# Patient Record
Sex: Female | Born: 1935 | Race: White | Hispanic: No | Smoking: Current every day smoker
Health system: Southern US, Community
[De-identification: ages and names within clinical notes are randomized; demographics above are authoritative.]

## PROBLEM LIST (undated history)

## (undated) DIAGNOSIS — I499 Cardiac arrhythmia, unspecified: Secondary | ICD-10-CM

## (undated) DIAGNOSIS — Z8542 Personal history of malignant neoplasm of other parts of uterus: Secondary | ICD-10-CM

## (undated) DIAGNOSIS — I10 Essential (primary) hypertension: Secondary | ICD-10-CM

## (undated) DIAGNOSIS — J449 Chronic obstructive pulmonary disease, unspecified: Secondary | ICD-10-CM

## (undated) DIAGNOSIS — Z720 Tobacco use: Secondary | ICD-10-CM

## (undated) DIAGNOSIS — G629 Polyneuropathy, unspecified: Secondary | ICD-10-CM

## (undated) DIAGNOSIS — I509 Heart failure, unspecified: Secondary | ICD-10-CM

## (undated) HISTORY — DX: Heart failure, unspecified: I50.9

## (undated) HISTORY — DX: Cardiac arrhythmia, unspecified: I49.9

## (undated) HISTORY — DX: Chronic obstructive pulmonary disease, unspecified: J44.9

## (undated) HISTORY — PX: OTHER SURGICAL HISTORY: SHX169

## (undated) HISTORY — PX: ABDOMINAL HYSTERECTOMY: SHX81

## (undated) HISTORY — DX: Tobacco use: Z72.0

## (undated) HISTORY — DX: Personal history of malignant neoplasm of other parts of uterus: Z85.42

## (undated) HISTORY — DX: Essential (primary) hypertension: I10

## (undated) HISTORY — DX: Polyneuropathy, unspecified: G62.9

---

## 2004-08-15 ENCOUNTER — Ambulatory Visit: Payer: Self-pay | Admitting: Internal Medicine

## 2004-11-06 ENCOUNTER — Ambulatory Visit: Payer: Self-pay | Admitting: Internal Medicine

## 2005-01-26 ENCOUNTER — Ambulatory Visit: Payer: Self-pay | Admitting: Internal Medicine

## 2006-09-12 ENCOUNTER — Ambulatory Visit: Payer: Self-pay | Admitting: Internal Medicine

## 2006-09-18 ENCOUNTER — Ambulatory Visit: Payer: Self-pay | Admitting: Orthopedic Surgery

## 2006-09-25 ENCOUNTER — Inpatient Hospital Stay: Payer: Self-pay | Admitting: Vascular Surgery

## 2007-04-02 ENCOUNTER — Ambulatory Visit: Payer: Self-pay

## 2007-11-15 ENCOUNTER — Emergency Department: Payer: Self-pay | Admitting: Unknown Physician Specialty

## 2007-11-24 ENCOUNTER — Ambulatory Visit: Payer: Self-pay | Admitting: Unknown Physician Specialty

## 2007-11-24 ENCOUNTER — Other Ambulatory Visit: Payer: Self-pay

## 2007-11-25 ENCOUNTER — Ambulatory Visit: Payer: Self-pay | Admitting: Unknown Physician Specialty

## 2007-11-27 ENCOUNTER — Ambulatory Visit: Payer: Self-pay | Admitting: Cardiology

## 2009-03-13 HISTORY — PX: HIP FRACTURE SURGERY: SHX118

## 2009-03-28 ENCOUNTER — Inpatient Hospital Stay: Payer: Self-pay | Admitting: General Practice

## 2009-04-02 ENCOUNTER — Encounter: Payer: Self-pay | Admitting: Internal Medicine

## 2009-04-13 ENCOUNTER — Encounter: Payer: Self-pay | Admitting: Internal Medicine

## 2009-05-13 ENCOUNTER — Encounter: Payer: Self-pay | Admitting: Internal Medicine

## 2009-05-30 ENCOUNTER — Inpatient Hospital Stay: Payer: Self-pay | Admitting: Internal Medicine

## 2009-08-02 ENCOUNTER — Ambulatory Visit: Payer: Self-pay | Admitting: Internal Medicine

## 2010-03-13 HISTORY — PX: COLONOSCOPY: SHX174

## 2010-03-17 ENCOUNTER — Inpatient Hospital Stay: Payer: Self-pay | Admitting: Internal Medicine

## 2010-03-23 ENCOUNTER — Encounter: Payer: Self-pay | Admitting: Internal Medicine

## 2010-04-13 ENCOUNTER — Encounter: Payer: Self-pay | Admitting: Internal Medicine

## 2010-05-13 ENCOUNTER — Encounter: Payer: Self-pay | Admitting: Internal Medicine

## 2011-08-14 HISTORY — PX: MOHS SURGERY: SHX181

## 2011-12-14 ENCOUNTER — Inpatient Hospital Stay: Payer: Self-pay | Admitting: Internal Medicine

## 2011-12-14 LAB — TROPONIN I: Troponin-I: <0.02 ng/mL

## 2011-12-14 LAB — PROTIME-INR: Prothrombin Time: 13.4 secs (ref 11.5–14.7)

## 2011-12-14 LAB — COMPREHENSIVE METABOLIC PANEL
Anion Gap: 7 (ref 7–16)
BUN: 12 mg/dL (ref 7–18)
Bilirubin,Total: 0.6 mg/dL (ref 0.2–1.0)
Calcium, Total: 8.5 mg/dL (ref 8.5–10.1)
Chloride: 95 mmol/L — ABNORMAL LOW (ref 98–107)
Creatinine: 0.7 mg/dL (ref 0.60–1.30)
Osmolality: 261 (ref 275–301)
Potassium: 4 mmol/L (ref 3.5–5.1)
SGPT (ALT): 52 U/L
Sodium: 130 mmol/L — ABNORMAL LOW (ref 136–145)
Total Protein: 6.1 g/dL — ABNORMAL LOW (ref 6.4–8.2)

## 2011-12-14 LAB — APTT: Activated PTT: 31 secs (ref 23.6–35.9)

## 2011-12-14 LAB — CBC
HCT: 41.9 % (ref 35.0–47.0)
HGB: 14 g/dL (ref 12.0–16.0)
MCH: 32.1 pg (ref 26.0–34.0)
MCHC: 33.5 g/dL (ref 32.0–36.0)
RBC: 4.37 10*6/uL (ref 3.80–5.20)
RDW: 14.1 % (ref 11.5–14.5)
WBC: 8 10*3/uL (ref 3.6–11.0)

## 2011-12-14 LAB — CK TOTAL AND CKMB (NOT AT ARMC)
CK, Total: 59 U/L (ref 21–215)
CK-MB: 2.2 ng/mL (ref 0.5–3.6)

## 2011-12-14 LAB — PRO B NATRIURETIC PEPTIDE: B-Type Natriuretic Peptide: 3298 pg/mL — ABNORMAL HIGH (ref 0–450)

## 2011-12-14 LAB — MAGNESIUM: Magnesium: 1.5 mg/dL — ABNORMAL LOW

## 2011-12-15 LAB — BASIC METABOLIC PANEL
BUN: 11 mg/dL (ref 7–18)
Calcium, Total: 8.1 mg/dL — ABNORMAL LOW (ref 8.5–10.1)
Chloride: 95 mmol/L — ABNORMAL LOW (ref 98–107)
Co2: 28 mmol/L (ref 21–32)
EGFR (African American): >60
Glucose: 109 mg/dL — ABNORMAL HIGH (ref 65–99)
Osmolality: 263 (ref 275–301)
Potassium: 3.5 mmol/L (ref 3.5–5.1)
Sodium: 131 mmol/L — ABNORMAL LOW (ref 136–145)

## 2011-12-15 LAB — CK-MB: CK-MB: 1.4 ng/mL (ref 0.5–3.6)

## 2011-12-16 LAB — BASIC METABOLIC PANEL
Anion Gap: 6 — ABNORMAL LOW (ref 7–16)
BUN: 13 mg/dL (ref 7–18)
Chloride: 92 mmol/L — ABNORMAL LOW (ref 98–107)
Co2: 29 mmol/L (ref 21–32)
EGFR (African American): >60
EGFR (Non-African Amer.): >60
Glucose: 125 mg/dL — ABNORMAL HIGH (ref 65–99)
Potassium: 3.7 mmol/L (ref 3.5–5.1)
Sodium: 127 mmol/L — ABNORMAL LOW (ref 136–145)

## 2011-12-18 LAB — PLATELET COUNT: Platelet: 214 10*3/uL (ref 150–440)

## 2011-12-20 LAB — CULTURE, BLOOD (SINGLE)

## 2012-01-12 DIAGNOSIS — I509 Heart failure, unspecified: Secondary | ICD-10-CM

## 2012-01-12 HISTORY — DX: Heart failure, unspecified: I50.9

## 2012-01-17 ENCOUNTER — Inpatient Hospital Stay: Payer: Self-pay | Admitting: Student

## 2012-01-17 LAB — COMPREHENSIVE METABOLIC PANEL
BUN: 15 mg/dL (ref 7–18)
Bilirubin,Total: 0.9 mg/dL (ref 0.2–1.0)
Calcium, Total: 8.7 mg/dL (ref 8.5–10.1)
Chloride: 98 mmol/L (ref 98–107)
Co2: 26 mmol/L (ref 21–32)
EGFR (African American): >60
SGOT(AST): 36 U/L (ref 15–37)
Sodium: 134 mmol/L — ABNORMAL LOW (ref 136–145)

## 2012-01-17 LAB — URINALYSIS, COMPLETE
Blood: NEGATIVE
Glucose,UR: NEGATIVE mg/dL (ref 0–75)
Hyaline Cast: 1
Ketone: NEGATIVE
Nitrite: NEGATIVE
Ph: 6 (ref 4.5–8.0)
RBC,UR: <1 /HPF (ref 0–5)
Transitional Epi: <1
WBC UR: 5 /HPF (ref 0–5)

## 2012-01-17 LAB — CBC WITH DIFFERENTIAL/PLATELET
Basophil #: 0 10*3/uL (ref 0.0–0.1)
Basophil %: 0.4 %
Eosinophil #: 0.1 10*3/uL (ref 0.0–0.7)
Eosinophil %: 1 %
Lymphocyte %: 11.5 %
MCHC: 33.2 g/dL (ref 32.0–36.0)
MCV: 97 fL (ref 80–100)
Monocyte #: 0.9 x10 3/mm (ref 0.2–0.9)
Neutrophil #: 6.2 10*3/uL (ref 1.4–6.5)
Platelet: 202 10*3/uL (ref 150–440)
RDW: 14.5 % (ref 11.5–14.5)

## 2012-01-17 LAB — PRO B NATRIURETIC PEPTIDE: B-Type Natriuretic Peptide: 2929 pg/mL — ABNORMAL HIGH (ref 0–450)

## 2012-01-17 LAB — TSH: Thyroid Stimulating Horm: 2.46 u[IU]/mL

## 2012-01-18 DIAGNOSIS — R0602 Shortness of breath: Secondary | ICD-10-CM

## 2012-01-18 LAB — CBC WITH DIFFERENTIAL/PLATELET
Basophil %: 0.3 %
Eosinophil %: 0.7 %
HCT: 46.9 % (ref 35.0–47.0)
HGB: 15.3 g/dL (ref 12.0–16.0)
Monocyte %: 10 %
Neutrophil #: 6.6 10*3/uL — ABNORMAL HIGH (ref 1.4–6.5)
Neutrophil %: 76.4 %
RBC: 4.86 10*6/uL (ref 3.80–5.20)
RDW: 14.4 % (ref 11.5–14.5)

## 2012-01-18 LAB — BASIC METABOLIC PANEL WITH GFR
Anion Gap: 10
BUN: 14 mg/dL
Calcium, Total: 8.8 mg/dL
Chloride: 101 mmol/L
Co2: 27 mmol/L
Creatinine: 0.66 mg/dL
EGFR (African American): >60
EGFR (Non-African Amer.): >60
Glucose: 128 mg/dL — ABNORMAL HIGH
Osmolality: 278
Potassium: 3.5 mmol/L
Sodium: 138 mmol/L

## 2012-01-18 LAB — MAGNESIUM: Magnesium: 1.7 mg/dL — ABNORMAL LOW

## 2012-01-19 DIAGNOSIS — I5043 Acute on chronic combined systolic (congestive) and diastolic (congestive) heart failure: Secondary | ICD-10-CM

## 2012-01-19 LAB — BASIC METABOLIC PANEL
BUN: 13 mg/dL (ref 7–18)
Chloride: 94 mmol/L — ABNORMAL LOW (ref 98–107)
Co2: 30 mmol/L (ref 21–32)
Creatinine: 0.59 mg/dL — ABNORMAL LOW (ref 0.60–1.30)
EGFR (Non-African Amer.): >60
Glucose: 125 mg/dL — ABNORMAL HIGH (ref 65–99)
Potassium: 3.2 mmol/L — ABNORMAL LOW (ref 3.5–5.1)

## 2012-01-19 LAB — MAGNESIUM: Magnesium: 1.4 mg/dL — ABNORMAL LOW

## 2012-01-20 LAB — BASIC METABOLIC PANEL
Anion Gap: 11 (ref 7–16)
BUN: 13 mg/dL (ref 7–18)
Calcium, Total: 9.2 mg/dL (ref 8.5–10.1)
Chloride: 91 mmol/L — ABNORMAL LOW (ref 98–107)
Co2: 31 mmol/L (ref 21–32)
Creatinine: 0.7 mg/dL (ref 0.60–1.30)
EGFR (African American): >60
EGFR (Non-African Amer.): >60

## 2012-01-21 LAB — BASIC METABOLIC PANEL
BUN: 15 mg/dL (ref 7–18)
Calcium, Total: 8.8 mg/dL (ref 8.5–10.1)
EGFR (African American): >60
Glucose: 171 mg/dL — ABNORMAL HIGH (ref 65–99)
Potassium: 3.9 mmol/L (ref 3.5–5.1)
Sodium: 131 mmol/L — ABNORMAL LOW (ref 136–145)

## 2012-01-22 LAB — BASIC METABOLIC PANEL
Calcium, Total: 8.8 mg/dL (ref 8.5–10.1)
Chloride: 95 mmol/L — ABNORMAL LOW (ref 98–107)
EGFR (Non-African Amer.): >60
Potassium: 4.5 mmol/L (ref 3.5–5.1)
Sodium: 133 mmol/L — ABNORMAL LOW (ref 136–145)

## 2012-01-22 LAB — MAGNESIUM: Magnesium: 1.8 mg/dL

## 2012-01-22 LAB — PLATELET COUNT: Platelet: 198 10*3/uL (ref 150–440)

## 2012-01-23 ENCOUNTER — Encounter: Payer: Self-pay | Admitting: Internal Medicine

## 2012-01-30 ENCOUNTER — Encounter: Payer: Self-pay | Admitting: Cardiovascular Disease

## 2012-02-04 ENCOUNTER — Encounter: Payer: Self-pay | Admitting: Cardiovascular Disease

## 2012-02-04 ENCOUNTER — Ambulatory Visit (INDEPENDENT_AMBULATORY_CARE_PROVIDER_SITE_OTHER): Payer: Medicare Other | Admitting: Cardiovascular Disease

## 2012-02-04 VITALS — BP 110/62 | HR 76 | Ht 64.0 in | Wt 146.0 lb

## 2012-02-04 DIAGNOSIS — I272 Pulmonary hypertension, unspecified: Secondary | ICD-10-CM | POA: Insufficient documentation

## 2012-02-04 DIAGNOSIS — R0602 Shortness of breath: Secondary | ICD-10-CM

## 2012-02-04 DIAGNOSIS — I5042 Chronic combined systolic (congestive) and diastolic (congestive) heart failure: Secondary | ICD-10-CM

## 2012-02-04 DIAGNOSIS — J4489 Other specified chronic obstructive pulmonary disease: Secondary | ICD-10-CM

## 2012-02-04 DIAGNOSIS — J449 Chronic obstructive pulmonary disease, unspecified: Secondary | ICD-10-CM

## 2012-02-04 DIAGNOSIS — I4891 Unspecified atrial fibrillation: Secondary | ICD-10-CM

## 2012-02-04 DIAGNOSIS — I2789 Other specified pulmonary heart diseases: Secondary | ICD-10-CM

## 2012-02-04 DIAGNOSIS — I509 Heart failure, unspecified: Secondary | ICD-10-CM

## 2012-02-04 NOTE — Assessment & Plan Note (Signed)
We have encouraged her to continue to work on weaning her cigarettes and smoking cessation. She will continue to work on this and does not want any assistance with chantix.  

## 2012-02-04 NOTE — Assessment & Plan Note (Signed)
Appears to be doing much better after her recent hospital admission 3 weeks ago. No evidence of significant fluid retention. We will continue her on her current medications.

## 2012-02-04 NOTE — Progress Notes (Signed)
Patient ID: Kristina Navarro, female    DOB: Jun 24, 1926, 78 y.o.   MRN: [id number]  HPI Comments: Kristina Navarro is a very pleasant 78 year old woman with a history of chronic atrial fibrillation, systolic and diastolic CHF with ejection fraction 40-45%, pulmonary hypertension, severe COPD with long smoking history who continues to smoke, who was in the hospital in May 2013 with increasing shortness of breath and leg swelling and discharge to rehabilitation facility who was admitted back to the hospital June 7 with shortness of breath. She was noted to be in atrial fibrillation with RVR, rate 140 beats per minute. BNP on admission was 3000  Her medications were adjusted, started on diltiazem, digoxin, continued on metoprolol with good rate control. Lasix was increased to 40 mg twice a day and she presents to [place] today. She reports that she is feeling well, is doing rehabilitation at [facility] rehabilitation. She is using a walker and has a caretaker. She denies any significant shortness of breath. She does have minimal edema in the left ankle, much better than before. She is drinking less now that we have than she normally does at home   Notes from May 2013 indicate she is not on Coumadin or anticoagulation due to her choice and history of GI bleed in the past, notes indicating small bowel bleed in 2011  Last echocardiogram 12/15/2011 showing ejection fraction 40-45%, LVH, moderate to severe TR, moderate MR, right trigger systolic pressure 39 mmHg   EKG shows atrial fibrillation with ventricular rate 76 beats per minute with old anterior infarct, left anterior fascicular block   Outpatient Encounter Prescriptions as of 02/04/2012  Medication Sig Dispense Refill  . aspirin 325 MG tablet 325 mg. Monday, Wednesday and Friday.      . calcipotriene (DOVONOX) 0.005 % cream Apply topically 2 (two) times daily.      . Cholecalciferol (VITAMIN D3) 400 UNITS CAPS Take 400 Units by mouth daily.       .  digoxin (LANOXIN) 0.125 MG tablet Take 125 mcg by mouth daily.      Marland Kitchen diltiazem (DILACOR XR) 240 MG 24 hr capsule Take 240 mg by mouth daily.      . furosemide (LASIX) 40 MG tablet Take 40 mg by mouth 2 (two) times daily.      . Ipratropium-Albuterol (COMBIVENT IN) Inhale 2 puffs into the lungs as needed.      . magnesium oxide (MAG-OX) 400 MG tablet Take 400 mg by mouth daily.      . metoprolol (LOPRESSOR) 50 MG tablet Take 50 mg by mouth 2 (two) times daily.      . Multiple Vitamin (MULTIVITAMIN) tablet Take 1 tablet by mouth 2 (two) times daily.       . NON FORMULARY Mineral capsule twice daily.      . NON FORMULARY Bone support capsule daily.      . NON FORMULARY Zygest capsule daily.      . potassium chloride SA (K-DUR,KLOR-CON) 20 MEQ tablet Take 20 mEq by mouth daily.      . raloxifene (EVISTA) 60 MG tablet Take 60 mg by mouth daily.        Review of Systems  Constitutional: Negative.   HENT: Negative.   Eyes: Negative.   Respiratory: Negative.   Cardiovascular: Negative.   Gastrointestinal: Negative.   Musculoskeletal: Negative.   Skin: Negative.   Neurological: Negative.   Hematological: Negative.   Psychiatric/Behavioral: Negative.   All other systems reviewed and are negative.  BP 110/62  Pulse 76  Ht 5\' 4"  (1.626 m)  Wt 146 lb (66.225 kg)  BMI 25.06 kg/m2  Physical Exam  Nursing note and vitals reviewed. Constitutional: She is oriented to person, place, and time. She appears well-developed and well-nourished.  HENT:  Head: Normocephalic.  Nose: Nose normal.  Mouth/Throat: Oropharynx is clear and moist.  Eyes: Conjunctivae are normal. Pupils are equal, round, and reactive to light.  Neck: Normal range of motion. Neck supple. No JVD present.  Cardiovascular: Normal rate, regular rhythm, S1 normal, S2 normal, normal heart sounds and intact distal pulses.  Exam reveals no gallop and no friction rub.   No murmur heard.      Minimal edema of the left lower  extremity  Pulmonary/Chest: Effort normal. No respiratory distress. She has decreased breath sounds. She has no wheezes. She has no rales. She exhibits no tenderness.  Abdominal: Soft. Bowel sounds are normal. She exhibits no distension. There is no tenderness.  Musculoskeletal: Normal range of motion. She exhibits edema. She exhibits no tenderness.  Lymphadenopathy:    She has no cervical adenopathy.  Neurological: She is alert and oriented to person, place, and time. Coordination normal.  Skin: Skin is warm and dry. No rash noted. No erythema.  Psychiatric: She has a normal mood and affect. Her behavior is normal. Judgment and thought content normal.         Assessment and Plan

## 2012-02-04 NOTE — Assessment & Plan Note (Signed)
Chronic atrial fibrillation, not on anticoagulation (per the notes by her request and history of GI bleed). We will discuss this with her on her next visit. Rate is well controlled on her current medication regimen.

## 2012-02-04 NOTE — Assessment & Plan Note (Signed)
Pulmonary hypertension seen on previous echocardiogram. We'll continue aggressive diuresis and limit fluid intake.

## 2012-02-04 NOTE — Patient Instructions (Addendum)
You are doing well. No medication changes were made.  Watch the weight. Call the office for weight gain more than 3 pounds.   Please call us if you have new issues that need to be addressed before your next appt.  Your physician wants you to follow-up in: 1 months.

## 2012-02-11 ENCOUNTER — Encounter: Payer: Self-pay | Admitting: Internal Medicine

## 2012-03-05 ENCOUNTER — Encounter: Payer: Self-pay | Admitting: Cardiovascular Disease

## 2012-03-05 ENCOUNTER — Ambulatory Visit (INDEPENDENT_AMBULATORY_CARE_PROVIDER_SITE_OTHER): Payer: Medicare Other | Admitting: Cardiovascular Disease

## 2012-03-05 VITALS — BP 102/72 | HR 99 | Ht 64.5 in | Wt 148.0 lb

## 2012-03-05 DIAGNOSIS — I509 Heart failure, unspecified: Secondary | ICD-10-CM

## 2012-03-05 DIAGNOSIS — J449 Chronic obstructive pulmonary disease, unspecified: Secondary | ICD-10-CM

## 2012-03-05 DIAGNOSIS — I5042 Chronic combined systolic (congestive) and diastolic (congestive) heart failure: Secondary | ICD-10-CM

## 2012-03-05 DIAGNOSIS — I4891 Unspecified atrial fibrillation: Secondary | ICD-10-CM

## 2012-03-05 NOTE — Assessment & Plan Note (Signed)
Heart rate is adequately controlled, chronic atrial fibrillation. We had a long discussion about anticoagulation and she prefers no anticoagulation at this time. She does have a history of GI bleed while she was taking NSAIDs. She has had several recent falls and uses a walker. We are referring her back to physical therapy.

## 2012-03-05 NOTE — Assessment & Plan Note (Signed)
We have suggested she stay on her current medication regiment. We will continue aggressive diuresis. She has followup with Dr. Arlana Pouch in the next week or so. We have suggested she talk with him about a digoxin level, basic metabolic panel and any other lab work he might need.

## 2012-03-05 NOTE — Progress Notes (Signed)
Patient ID: Kristina Navarro, female    DOB: October 24, 1925, 78 y.o.   MRN: [id number]  HPI Comments: Kristina Navarro is a very pleasant 78 year old woman with a history of chronic atrial fibrillation, systolic and diastolic CHF with ejection fraction 40-45%, pulmonary hypertension, severe COPD with long smoking history, who was in the hospital in May 2013 with increasing shortness of breath and leg swelling and discharge to rehabilitation facility who was admitted back to the hospital June 7 with shortness of breath. She was noted to be in atrial fibrillation with RVR, rate 140 beats per minute. BNP on admission was 3000  Her medications were adjusted, started on diltiazem, digoxin, continued on metoprolol with good rate control. Lasix was increased to 40 mg twice a day. She completed  [facility] rehabilitation. She is using a walker and has a caretaker.  She is drinking less now that we have than she normally does at home. He denies any tachycardia. Mild baseline shortness of breath. She would like to continue physical therapy at [facility].   Notes from May 2013 indicate she is not on Coumadin or anticoagulation due to her choice and history of GI bleed in the past, notes indicating small bowel bleed in 2011  Last echocardiogram 12/15/2011 showing ejection fraction 40-45%, LVH, moderate to severe TR, moderate MR, right trigger systolic pressure 39 mmHg   EKG shows atrial fibrillation with ventricular rate 99 beats per minute with old anterior infarct, left anterior fascicular block Rate did improve/slow with rest   Outpatient Encounter Prescriptions as of 03/05/2012  Medication Sig Dispense Refill  . aspirin 325 MG tablet 325 mg. Monday, Wednesday and Friday.      . calcipotriene (DOVONOX) 0.005 % cream Apply topically 2 (two) times daily.      . Cholecalciferol (VITAMIN D3) 400 UNITS CAPS Take 400 Units by mouth daily.       . digoxin (LANOXIN) 0.125 MG tablet Take 125 mcg by mouth daily.      Marland Kitchen  diltiazem (DILACOR XR) 240 MG 24 hr capsule Take 240 mg by mouth daily.      . furosemide (LASIX) 40 MG tablet Take 40 mg by mouth 2 (two) times daily.      . Ipratropium-Albuterol (COMBIVENT IN) Inhale 2 puffs into the lungs as needed.      . magnesium oxide (MAG-OX) 400 MG tablet Take 400 mg by mouth daily.      . metoprolol (LOPRESSOR) 50 MG tablet Take 50 mg by mouth 2 (two) times daily.      . Multiple Vitamin (MULTIVITAMIN) tablet Take 1 tablet by mouth 2 (two) times daily.       . NON FORMULARY Mineral capsule twice daily.      . NON FORMULARY Bone support capsule daily.      . NON FORMULARY Zygest capsule daily.      . potassium chloride SA (K-DUR,KLOR-CON) 20 MEQ tablet Take 20 mEq by mouth daily.      . raloxifene (EVISTA) 60 MG tablet Take 60 mg by mouth daily.         Review of Systems  Constitutional: Negative.   HENT: Negative.   Eyes: Negative.   Respiratory: Positive for shortness of breath.   Cardiovascular: Positive for palpitations.  Gastrointestinal: Negative.   Musculoskeletal: Positive for gait problem.  Skin: Negative.   Neurological: Negative.   Hematological: Negative.   Psychiatric/Behavioral: Negative.   All other systems reviewed and are negative.   BP 102/72  Pulse 99  Ht 5' 4.5" (1.638 m)  Wt 148 lb (67.132 kg)  BMI 25.01 kg/m2  Physical Exam  Nursing note and vitals reviewed. Constitutional: She is oriented to person, place, and time. She appears well-developed and well-nourished.  HENT:  Head: Normocephalic.  Nose: Nose normal.  Mouth/Throat: Oropharynx is clear and moist.  Eyes: Conjunctivae are normal. Pupils are equal, round, and reactive to light.  Neck: Normal range of motion. Neck supple. No JVD present.  Cardiovascular: Normal rate, S1 normal, S2 normal and intact distal pulses.  An irregularly irregular rhythm present. Exam reveals no gallop and no friction rub.   Murmur heard.  Systolic murmur is present with a grade of 2/6        Minimal edema of the left lower extremity  Pulmonary/Chest: Effort normal. No respiratory distress. She has decreased breath sounds. She has no wheezes. She has no rales. She exhibits no tenderness.  Abdominal: Soft. Bowel sounds are normal. She exhibits no distension. There is no tenderness.  Musculoskeletal: Normal range of motion. She exhibits no tenderness.  Lymphadenopathy:    She has no cervical adenopathy.  Neurological: She is alert and oriented to person, place, and time. Coordination normal.  Skin: Skin is warm and dry. No rash noted. No erythema.  Psychiatric: She has a normal mood and affect. Her behavior is normal. Judgment and thought content normal.         Assessment and Plan

## 2012-03-05 NOTE — Patient Instructions (Addendum)
You are doing well. No medication changes were made.  Please call us if you have new issues that need to be addressed before your next appt.  Your physician wants you to follow-up in: 3 months You will receive a reminder letter in the mail two months in advance. If you don't receive a letter, please call our office to schedule the follow-up appointment.   

## 2012-03-05 NOTE — Assessment & Plan Note (Signed)
Stable baseline mild shortness of breath. On inhalers.

## 2012-03-10 ENCOUNTER — Telehealth: Payer: Self-pay | Admitting: Cardiovascular Disease

## 2012-03-10 NOTE — Telephone Encounter (Signed)
Left message with caretaker re: referral has been faxed and pt should get correspondence from PT at [facility]. Understanding verb

## 2012-03-10 NOTE — Telephone Encounter (Signed)
Pt was wanting to know if you had heard anything about her referral for PT

## 2012-03-10 NOTE — Telephone Encounter (Signed)
Referral faxed

## 2012-04-01 ENCOUNTER — Encounter: Payer: Self-pay | Admitting: Cardiovascular Disease

## 2012-04-13 ENCOUNTER — Encounter: Payer: Self-pay | Admitting: Cardiovascular Disease

## 2012-05-13 ENCOUNTER — Encounter: Payer: Self-pay | Admitting: Cardiovascular Disease

## 2012-06-05 ENCOUNTER — Encounter: Payer: Self-pay | Admitting: Cardiovascular Disease

## 2012-06-05 ENCOUNTER — Ambulatory Visit (INDEPENDENT_AMBULATORY_CARE_PROVIDER_SITE_OTHER): Payer: Medicare Other | Admitting: Cardiovascular Disease

## 2012-06-05 VITALS — BP 129/76 | HR 83 | Wt 143.0 lb

## 2012-06-05 DIAGNOSIS — J449 Chronic obstructive pulmonary disease, unspecified: Secondary | ICD-10-CM

## 2012-06-05 DIAGNOSIS — I509 Heart failure, unspecified: Secondary | ICD-10-CM

## 2012-06-05 DIAGNOSIS — I5042 Chronic combined systolic (congestive) and diastolic (congestive) heart failure: Secondary | ICD-10-CM

## 2012-06-05 DIAGNOSIS — I4891 Unspecified atrial fibrillation: Secondary | ICD-10-CM

## 2012-06-05 MED ORDER — MAGNESIUM OXIDE 400 MG PO TABS: 400.0000 mg | ORAL_TABLET | Freq: Every day | ORAL | Status: DC

## 2012-06-05 NOTE — Progress Notes (Signed)
Patient ID: Kristina Navarro, female    DOB: 08-May-1926, 78 y.o.   MRN: [id number]  HPI Comments: Kristina Navarro is a very pleasant 78 year old woman with a history of chronic atrial fibrillation, systolic and diastolic CHF with ejection fraction 40-45%, pulmonary hypertension, severe COPD with long smoking history, who was in the hospital in May 2013 with increasing shortness of breath and leg swelling and discharge to rehabilitation facility who was admitted back to the hospital June 7 with shortness of breath. She was noted to be in atrial fibrillation with RVR, rate 140 beats per minute. BNP on admission was 3000  Her medications were adjusted, started on diltiazem, digoxin, continued on metoprolol with good rate control. Lasix was increased to 40 mg twice a day. She completed  [facility] rehabilitation. She is using a walker and has a caretaker. She reports that she is doing well. She continues to smoke daily and will continue to do so. Her weight is down from her last clinic visit approximately 4-5 pounds. She continues to have trace edema of her legs and she keeps her legs elevated when she can. Her legs were very weak and she participated in physical therapy at [facility] with good results.  Anticoagulation again was discussed with her and she has indicated she does not want warfarin for atrial fibrillation. She is otherwise active and has no complaints   Notes from May 2013 indicate she is not on Coumadin or anticoagulation due to her choice and history of GI bleed in the past, notes indicating small bowel bleed in 2011  Last echocardiogram 12/15/2011 showing ejection fraction 40-45%, LVH, moderate to severe TR, moderate MR, right trigger systolic pressure 39 mmHg   EKG shows atrial fibrillation with ventricular rate 83 beats per minute with old anterior MI, left anterior fascicular block   Outpatient Encounter Prescriptions as of 06/05/2012  Medication Sig Dispense Refill  . aspirin 325 MG  tablet 325 mg. Monday, Wednesday and Friday.      . calcipotriene (DOVONOX) 0.005 % cream Apply topically 2 (two) times daily.      . Cholecalciferol (VITAMIN D3) 400 UNITS CAPS Take 400 Units by mouth daily.       . digoxin (LANOXIN) 0.125 MG tablet Take 125 mcg by mouth daily.      Marland Kitchen diltiazem (DILACOR XR) 240 MG 24 hr capsule Take 240 mg by mouth daily.      . furosemide (LASIX) 40 MG tablet Take 40 mg by mouth 2 (two) times daily.      . Ipratropium-Albuterol (COMBIVENT IN) Inhale 2 puffs into the lungs as needed.      . magnesium oxide (MAG-OX) 400 MG tablet Take 1 tablet (400 mg total) by mouth daily.  90 tablet  3  . metoprolol (LOPRESSOR) 50 MG tablet Take 50 mg by mouth 2 (two) times daily.      . Multiple Vitamin (MULTIVITAMIN) tablet Take 1 tablet by mouth 2 (two) times daily.       . NON FORMULARY Mineral capsule twice daily.      . NON FORMULARY Bone support capsule daily.      . NON FORMULARY Zygest capsule daily.      . potassium chloride SA (K-DUR,KLOR-CON) 20 MEQ tablet Take 20 mEq by mouth daily.      . raloxifene (EVISTA) 60 MG tablet Take 60 mg by mouth daily.      Marland Kitchen DISCONTD: magnesium oxide (MAG-OX) 400 MG tablet Take 400 mg by mouth daily.  Review of Systems  Constitutional: Negative.   HENT: Negative.   Eyes: Negative.   Respiratory: Positive for shortness of breath.   Gastrointestinal: Negative.   Musculoskeletal: Positive for gait problem.  Skin: Negative.   Neurological: Negative.   Hematological: Negative.   Psychiatric/Behavioral: Negative.   All other systems reviewed and are negative.   BP 129/76  Pulse 83  Wt 143 lb (64.864 kg)  Physical Exam  Nursing note and vitals reviewed. Constitutional: She is oriented to person, place, and time. She appears well-developed and well-nourished.  HENT:  Head: Normocephalic.  Nose: Nose normal.  Mouth/Throat: Oropharynx is clear and moist.  Eyes: Conjunctivae normal are normal. Pupils are equal,  round, and reactive to light.  Neck: Normal range of motion. Neck supple. No JVD present.  Cardiovascular: Normal rate, S1 normal, S2 normal and intact distal pulses.  An irregularly irregular rhythm present. Exam reveals no gallop and no friction rub.   Murmur heard.  Systolic murmur is present with a grade of 2/6       Minimal edema of the left lower extremity  Pulmonary/Chest: Effort normal. No respiratory distress. She has decreased breath sounds. She has no wheezes. She has no rales. She exhibits no tenderness.  Abdominal: Soft. Bowel sounds are normal. She exhibits no distension. There is no tenderness.  Musculoskeletal: Normal range of motion. She exhibits no tenderness.  Lymphadenopathy:    She has no cervical adenopathy.  Neurological: She is alert and oriented to person, place, and time. Coordination normal.  Skin: Skin is warm and dry. No rash noted. No erythema.  Psychiatric: She has a normal mood and affect. Her behavior is normal. Judgment and thought content normal.         Assessment and Plan

## 2012-06-05 NOTE — Assessment & Plan Note (Signed)
Rate is adequately controlled on her current medication regimen. She does not want anticoagulation. No signs of worsening diastolic CHF. We will continue her on her current medications.

## 2012-06-05 NOTE — Patient Instructions (Addendum)
You are doing well. No medication changes were made.  Please call us if you have new issues that need to be addressed before your next appt.  Your physician wants you to follow-up in: 6 months.  You will receive a reminder letter in the mail two months in advance. If you don't receive a letter, please call our office to schedule the follow-up appointment.   

## 2012-06-05 NOTE — Assessment & Plan Note (Signed)
We have encouraged her to continue to work on weaning her cigarettes and smoking cessation. She will continue to work on this and does not want any assistance with chantix.  

## 2012-06-05 NOTE — Assessment & Plan Note (Signed)
Weight is down from her prior visit. Stable shortness of breath likely from COPD. No medication changes.

## 2012-12-10 ENCOUNTER — Ambulatory Visit (INDEPENDENT_AMBULATORY_CARE_PROVIDER_SITE_OTHER): Payer: Medicare Other | Admitting: Cardiovascular Disease

## 2012-12-10 ENCOUNTER — Encounter: Payer: Self-pay | Admitting: Cardiovascular Disease

## 2012-12-10 VITALS — BP 123/73 | HR 70 | Ht 64.5 in | Wt 141.2 lb

## 2012-12-10 DIAGNOSIS — I509 Heart failure, unspecified: Secondary | ICD-10-CM

## 2012-12-10 DIAGNOSIS — I5042 Chronic combined systolic (congestive) and diastolic (congestive) heart failure: Secondary | ICD-10-CM

## 2012-12-10 DIAGNOSIS — R0602 Shortness of breath: Secondary | ICD-10-CM

## 2012-12-10 DIAGNOSIS — J449 Chronic obstructive pulmonary disease, unspecified: Secondary | ICD-10-CM

## 2012-12-10 DIAGNOSIS — Z79899 Other long term (current) drug therapy: Secondary | ICD-10-CM

## 2012-12-10 DIAGNOSIS — I4891 Unspecified atrial fibrillation: Secondary | ICD-10-CM

## 2012-12-10 NOTE — Assessment & Plan Note (Signed)
Shortness of breath is relatively stable. She is relatively sedentary

## 2012-12-10 NOTE — Assessment & Plan Note (Signed)
Chronic mild shortness of breath. She continues to smoke.

## 2012-12-10 NOTE — Progress Notes (Signed)
Patient ID: Kristina Navarro, female    DOB: 27-Jun-1926, 79 y.o.   MRN: [id number]  HPI Comments: Kristina Navarro is a very pleasant 79 year old woman with a history of chronic atrial fibrillation, systolic and diastolic CHF with ejection fraction 40-45%, pulmonary hypertension, severe COPD with long smoking history, who was in the hospital in May 2013 with increasing shortness of breath and leg swelling and discharged to rehabilitation facility who was admitted back to the hospital January 18 2012 with shortness of breath. She was noted to be in atrial fibrillation with RVR, rate 140 beats per minute. BNP on admission was 3000 small bowel bleed in 2011  Her medications were adjusted, started on diltiazem, digoxin, continued on metoprolol with good rate control. Lasix was increased to 40 mg twice a day. She completed  [facility]. She has a caretaker. She reports that she is doing well. She continues to smoke daily and "will continue to do so".  She has edema of her legs, particularly her feet today. She took only a 1/2 lasix yesterday PM. Appeared to reports edema was better yesterday . she keeps her legs elevated when she can.   Anticoagulation for atrial fibrillation was discussed with her and she has indicated she does not want warfarin for atrial fibrillation. She is otherwise active and has no complaints   Notes from May 2013 indicate she is not on Coumadin or anticoagulation due to her choice and history of GI bleed in the past, notes indicating small bowel bleed in 2011  Last echocardiogram 12/15/2011 showing ejection fraction 40-45%, LVH, moderate to severe TR, moderate MR, right trigger systolic pressure 39 mmHg Cholesterol 148, LDL 71, HDL 57 in October 2012   EKG shows atrial fibrillation with ventricular rate 70 beats per minute with old anterior MI, left anterior fascicular block   Outpatient Encounter Prescriptions as of 12/10/2012  Medication Sig Dispense Refill  . aspirin  325 MG tablet 325 mg. Monday, Wednesday and Friday.      . calcipotriene (DOVONOX) 0.005 % cream Apply topically 2 (two) times daily.      . Cholecalciferol (VITAMIN D3) 400 UNITS CAPS Take 400 Units by mouth daily.       . digoxin (LANOXIN) 0.125 MG tablet Take 125 mcg by mouth daily.      Marland Kitchen diltiazem (DILACOR XR) 240 MG 24 hr capsule Take 240 mg by mouth daily.      . furosemide (LASIX) 40 MG tablet Take 40 mg by mouth 2 (two) times daily.      . Ipratropium-Albuterol (COMBIVENT IN) Inhale 2 puffs into the lungs as needed.      . metoprolol (LOPRESSOR) 50 MG tablet Take 50 mg by mouth 2 (two) times daily.      . Multiple Vitamin (MULTIVITAMIN) tablet Take 1 tablet by mouth 2 (two) times daily.       . NON FORMULARY Mineral capsule twice daily.      . NON FORMULARY Bone support capsule daily.      . NON FORMULARY Zygest capsule daily.      . potassium chloride SA (K-DUR,KLOR-CON) 20 MEQ tablet Take 20 mEq by mouth daily.      . raloxifene (EVISTA) 60 MG tablet Take 60 mg by mouth daily.      . [DISCONTINUED] magnesium oxide (MAG-OX) 400 MG tablet Take 1 tablet (400 mg total) by mouth daily.  90 tablet  3   No facility-administered encounter medications on file as of 12/10/2012.  Review of Systems  Constitutional: Negative.   HENT: Negative.   Eyes: Negative.   Respiratory: Positive for shortness of breath.   Cardiovascular: Positive for leg swelling.  Gastrointestinal: Negative.   Musculoskeletal: Positive for gait problem.  Skin: Negative.   Neurological: Negative.   Psychiatric/Behavioral: Negative.   All other systems reviewed and are negative.   BP 123/73  Pulse 70  Ht 5' 4.5" (1.638 m)  Wt 141 lb 4 oz (64.071 kg)  BMI 23.88 kg/m2  Physical Exam  Nursing note and vitals reviewed. Constitutional: She is oriented to person, place, and time. She appears well-developed and well-nourished.  HENT:  Head: Normocephalic.  Nose: Nose normal.  Mouth/Throat: Oropharynx is  clear and moist.  Eyes: Conjunctivae are normal. Pupils are equal, round, and reactive to light.  Neck: Normal range of motion. Neck supple. No JVD present.  Cardiovascular: Normal rate, S1 normal, S2 normal and intact distal pulses.  An irregularly irregular rhythm present. Exam reveals no gallop and no friction rub.   Murmur heard.  Systolic murmur is present with a grade of 2/6  Minimal edema of the left lower extremity, 1+ edema in the feet  Pulmonary/Chest: Effort normal. No respiratory distress. She has decreased breath sounds. She has no wheezes. She has no rales. She exhibits no tenderness.  Abdominal: Soft. Bowel sounds are normal. She exhibits no distension. There is no tenderness.  Musculoskeletal: Normal range of motion. She exhibits no edema and no tenderness.  Lymphadenopathy:    She has no cervical adenopathy.  Neurological: She is alert and oriented to person, place, and time. Coordination normal.  Skin: Skin is warm and dry. No rash noted. No erythema.  Psychiatric: She has a normal mood and affect. Her behavior is normal. Judgment and thought content normal.    Assessment and Plan

## 2012-12-10 NOTE — Patient Instructions (Addendum)
You are doing well. No medication changes were made.  We will do blood work today  Please call us if you have new issues that need to be addressed before your next appt.  Your physician wants you to follow-up in: 6 months.  You will receive a reminder letter in the mail two months in advance. If you don't receive a letter, please call our office to schedule the follow-up appointment.   

## 2012-12-10 NOTE — Assessment & Plan Note (Signed)
Rate is reasonably well controlled on her current medications. She does not want anticoagulation and is aware of the risk of stroke

## 2012-12-10 NOTE — Assessment & Plan Note (Addendum)
We've recommended that she stay on her Lasix, take extra Lasix for worsening edema. No recent blood work. We will do basic metabolic panel today and digoxin level.

## 2012-12-11 LAB — DIGOXIN LEVEL: Digoxin Level: 1.2 ng/mL (ref 0.9–2.0)

## 2012-12-11 LAB — BASIC METABOLIC PANEL
BUN: 11 mg/dL (ref 8–27)
GFR calc Af Amer: 96 mL/min/{1.73_m2} (ref >59)
GFR calc non Af Amer: 83 mL/min/{1.73_m2} (ref >59)
Glucose: 123 mg/dL — ABNORMAL HIGH (ref 65–99)

## 2013-01-11 ENCOUNTER — Ambulatory Visit: Payer: Self-pay | Admitting: Internal Medicine

## 2013-01-20 LAB — CBC
HGB: 15.4 g/dL (ref 12.0–16.0)
MCHC: 33.4 g/dL (ref 32.0–36.0)
MCV: 93 fL (ref 80–100)
Platelet: 83 10*3/uL — ABNORMAL LOW (ref 150–440)
RBC: 4.93 10*6/uL (ref 3.80–5.20)

## 2013-01-20 LAB — COMPREHENSIVE METABOLIC PANEL
Albumin: 3.4 g/dL (ref 3.4–5.0)
Anion Gap: 10 (ref 7–16)
BUN: 12 mg/dL (ref 7–18)
Calcium, Total: 8.7 mg/dL (ref 8.5–10.1)
Chloride: 96 mmol/L — ABNORMAL LOW (ref 98–107)
Co2: 26 mmol/L (ref 21–32)
EGFR (Non-African Amer.): >60
Glucose: 154 mg/dL — ABNORMAL HIGH (ref 65–99)
Potassium: 3.3 mmol/L — ABNORMAL LOW (ref 3.5–5.1)
SGOT(AST): 30 U/L (ref 15–37)

## 2013-01-21 ENCOUNTER — Inpatient Hospital Stay: Payer: Self-pay | Admitting: Internal Medicine

## 2013-01-21 LAB — CBC WITH DIFFERENTIAL/PLATELET
Basophil #: 0 10*3/uL (ref 0.0–0.1)
Eosinophil #: 0 10*3/uL (ref 0.0–0.7)
Eosinophil %: 0.6 %
HCT: 47 % (ref 35.0–47.0)
MCHC: 33.7 g/dL (ref 32.0–36.0)
Monocyte #: 0.1 x10 3/mm — ABNORMAL LOW (ref 0.2–0.9)
Monocyte %: 20.6 %
Neutrophil #: 0.1 10*3/uL — ABNORMAL LOW (ref 1.4–6.5)
Neutrophil %: 25.8 %
RDW: 14.4 % (ref 11.5–14.5)
WBC: 0.6 10*3/uL — CL (ref 3.6–11.0)

## 2013-01-21 LAB — URINALYSIS, COMPLETE
Bilirubin,UR: NEGATIVE
Glucose,UR: NEGATIVE mg/dL (ref 0–75)
Ketone: NEGATIVE
Ph: 6 (ref 4.5–8.0)
RBC,UR: 16 /HPF (ref 0–5)
Squamous Epithelial: <1

## 2013-01-21 LAB — DIGOXIN LEVEL: Digoxin: 0.87 ng/mL

## 2013-01-22 LAB — CBC WITH DIFFERENTIAL/PLATELET
Comment - H1-Com1: NORMAL
HCT: 47 % (ref 35.0–47.0)
Lymphocytes: 38 %
MCH: 31.7 pg (ref 26.0–34.0)
MCV: 93 fL (ref 80–100)
Platelet: 68 10*3/uL — ABNORMAL LOW (ref 150–440)
RDW: 14.5 % (ref 11.5–14.5)
Segmented Neutrophils: 28 %
Variant Lymphocyte - H1-Rlymph: 16 %
WBC: 1.1 10*3/uL — CL (ref 3.6–11.0)

## 2013-01-22 LAB — BASIC METABOLIC PANEL
Anion Gap: 7 (ref 7–16)
BUN: 12 mg/dL (ref 7–18)
Calcium, Total: 9.6 mg/dL (ref 8.5–10.1)
Chloride: 100 mmol/L (ref 98–107)
Co2: 28 mmol/L (ref 21–32)
Creatinine: 0.66 mg/dL (ref 0.60–1.30)
EGFR (African American): >60
Glucose: 192 mg/dL — ABNORMAL HIGH (ref 65–99)
Osmolality: 275 (ref 275–301)
Potassium: 3.5 mmol/L (ref 3.5–5.1)

## 2013-01-22 LAB — MAGNESIUM: Magnesium: 1.6 mg/dL — ABNORMAL LOW

## 2013-01-22 LAB — TSH: Thyroid Stimulating Horm: 0.35 u[IU]/mL — ABNORMAL LOW

## 2013-01-23 LAB — CBC WITH DIFFERENTIAL/PLATELET
Bands: 4 %
HCT: 46.5 % (ref 35.0–47.0)
Lymphocytes: 40 %
MCH: 31.4 pg (ref 26.0–34.0)
MCHC: 33.7 g/dL (ref 32.0–36.0)
MCV: 93 fL (ref 80–100)
RBC: 4.99 10*6/uL (ref 3.80–5.20)
RDW: 14.4 % (ref 11.5–14.5)
Segmented Neutrophils: 26 %

## 2013-01-23 LAB — BASIC METABOLIC PANEL
BUN: 19 mg/dL — ABNORMAL HIGH (ref 7–18)
Calcium, Total: 8.9 mg/dL (ref 8.5–10.1)
Chloride: 101 mmol/L (ref 98–107)
Creatinine: 0.84 mg/dL (ref 0.60–1.30)
EGFR (African American): >60
Glucose: 166 mg/dL — ABNORMAL HIGH (ref 65–99)
Sodium: 136 mmol/L (ref 136–145)

## 2013-01-23 LAB — MAGNESIUM: Magnesium: 1.9 mg/dL

## 2013-01-24 LAB — CBC WITH DIFFERENTIAL/PLATELET
Basophil #: 0 x10 3/mm 3
Basophil %: 0.1 %
Eosinophil #: 0 x10 3/mm 3
Eosinophil %: 0.2 %
HCT: 47.5 % — ABNORMAL HIGH
HGB: 16.2 g/dL — ABNORMAL HIGH
Lymphocyte %: 56.8 %
Lymphs Abs: 2.2 x10 3/mm 3
MCH: 31.9 pg
MCHC: 34 g/dL
MCV: 94 fL
Monocyte #: 1.4 "x10 3/mm " — ABNORMAL HIGH
Monocyte %: 37.3 %
Neutrophil #: 0.2 x10 3/mm 3 — ABNORMAL LOW
Neutrophil %: 5.6 %
Platelet: 100 x10 3/mm 3 — ABNORMAL LOW
RBC: 5.07 X10 6/mm 3
RDW: 14.6 % — ABNORMAL HIGH
WBC: 3.9 x10 3/mm 3

## 2013-01-24 LAB — TSH: Thyroid Stimulating Horm: 0.741 u[IU]/mL

## 2013-01-24 LAB — T4, FREE: Free Thyroxine: 1.23 ng/dL (ref 0.76–1.46)

## 2013-01-25 LAB — HEMOGLOBIN A1C: Hemoglobin A1C: 6.5 % — ABNORMAL HIGH (ref 4.2–6.3)

## 2013-01-25 LAB — CBC WITH DIFFERENTIAL/PLATELET
Comment - H1-Com2: NORMAL
Eosinophil: 1 %
Lymphocytes: 36 %
MCH: 31.2 pg (ref 26.0–34.0)
MCV: 93 fL (ref 80–100)
Platelet: 117 10*3/uL — ABNORMAL LOW (ref 150–440)
RDW: 14.6 % — ABNORMAL HIGH (ref 11.5–14.5)
WBC: 4.6 10*3/uL (ref 3.6–11.0)

## 2013-01-26 LAB — CBC WITH DIFFERENTIAL/PLATELET
Basophil #: 0 10*3/uL (ref 0.0–0.1)
Eosinophil #: 0.1 10*3/uL (ref 0.0–0.7)
HCT: 48.4 % — ABNORMAL HIGH (ref 35.0–47.0)
HGB: 16.6 g/dL — ABNORMAL HIGH (ref 12.0–16.0)
Lymphocyte #: 1.9 10*3/uL (ref 1.0–3.6)
Lymphocyte %: 49.3 %
MCHC: 34.2 g/dL (ref 32.0–36.0)
MCV: 93 fL (ref 80–100)
Monocyte #: 1.4 x10 3/mm — ABNORMAL HIGH (ref 0.2–0.9)
Monocyte %: 35.6 %
Platelet: 139 10*3/uL — ABNORMAL LOW (ref 150–440)
RDW: 14.4 % (ref 11.5–14.5)

## 2013-02-10 ENCOUNTER — Ambulatory Visit: Payer: Self-pay | Admitting: Internal Medicine

## 2013-02-17 ENCOUNTER — Ambulatory Visit: Payer: Self-pay | Admitting: Internal Medicine

## 2013-02-17 LAB — CBC CANCER CENTER
Basophil %: 0.4 %
Eosinophil %: 2.2 %
HCT: 44.4 % (ref 35.0–47.0)
Lymphocyte #: 1.1 x10 3/mm (ref 1.0–3.6)
Lymphocyte %: 16.6 %
MCHC: 34.5 g/dL (ref 32.0–36.0)
Monocyte #: 0.8 x10 3/mm (ref 0.2–0.9)
Monocyte %: 12.4 %
Neutrophil #: 4.6 x10 3/mm (ref 1.4–6.5)
Platelet: 212 x10 3/mm (ref 150–440)
RDW: 13.8 % (ref 11.5–14.5)

## 2013-02-26 ENCOUNTER — Telehealth: Payer: Self-pay | Admitting: *Deleted

## 2013-02-26 NOTE — Telephone Encounter (Signed)
Error

## 2013-03-13 ENCOUNTER — Ambulatory Visit: Payer: Self-pay | Admitting: Internal Medicine

## 2013-05-18 ENCOUNTER — Telehealth: Payer: Self-pay | Admitting: *Deleted

## 2013-05-18 DIAGNOSIS — I509 Heart failure, unspecified: Secondary | ICD-10-CM

## 2013-05-18 MED ORDER — FUROSEMIDE 40 MG PO TABS: 40.0000 mg | ORAL_TABLET | Freq: Two times a day (BID) | ORAL | Status: DC

## 2013-05-18 NOTE — Telephone Encounter (Signed)
Patient called and her feet and ankles are swelling.

## 2013-05-18 NOTE — Telephone Encounter (Signed)
Spoke w/ pt.  States that her ankles are swelling and she can't get her shoes on. She has been in rehab and was not aware that she "could get more lasix". She keeps her legs elevated, but does not wear any type of compression hose, as they are too hard to put on.  Informed pt she can purchase zippered hose on [email or web address]. States that her weight goes up and down, sometimes 2-3 lbs a day, but she does not take an extra lasix, as her rx "only says twice a day". Had discussion with pt about daily wts and the importance of managing symptoms. Mailed pt information on CHF. Pt would like rx sent to her pharmacy for more lasix, as she is getting low.

## 2013-05-27 ENCOUNTER — Telehealth: Payer: Self-pay

## 2013-05-27 NOTE — Telephone Encounter (Signed)
Spoke w/ pt.  She is currently taking her Lasix 40 mg 3 pills twice a day. She is weighing herself daily, wt has not increased.  She sits so much that her butt hurts.  Is not wearing compression hose, as they are too hard to put on.  Discussed w/ pt purpose of compression hose vs diuretics. She verbalizes understanding.  She will have her daughter look on [email or web address] for zippered compression hose and keep appt w/ Dr. Mariah Milling next week.

## 2013-05-27 NOTE — Telephone Encounter (Signed)
Pt called and states pharmacist asks that she calls and talks with Dr Mariah Milling about her Lasix, weather she should increase this. States her ankles are still swealling, and she states she has been increasing her dose herself. Please call.

## 2013-06-10 ENCOUNTER — Encounter: Payer: Self-pay | Admitting: Cardiovascular Disease

## 2013-06-10 ENCOUNTER — Telehealth: Payer: Self-pay

## 2013-06-10 ENCOUNTER — Ambulatory Visit (INDEPENDENT_AMBULATORY_CARE_PROVIDER_SITE_OTHER): Payer: Medicare Other | Admitting: Cardiovascular Disease

## 2013-06-10 VITALS — BP 112/60 | HR 70 | Ht 64.5 in | Wt 131.1 lb

## 2013-06-10 DIAGNOSIS — R0602 Shortness of breath: Secondary | ICD-10-CM

## 2013-06-10 DIAGNOSIS — I4891 Unspecified atrial fibrillation: Secondary | ICD-10-CM

## 2013-06-10 DIAGNOSIS — J449 Chronic obstructive pulmonary disease, unspecified: Secondary | ICD-10-CM

## 2013-06-10 DIAGNOSIS — I5042 Chronic combined systolic (congestive) and diastolic (congestive) heart failure: Secondary | ICD-10-CM

## 2013-06-10 DIAGNOSIS — F17200 Nicotine dependence, unspecified, uncomplicated: Secondary | ICD-10-CM | POA: Insufficient documentation

## 2013-06-10 DIAGNOSIS — I509 Heart failure, unspecified: Secondary | ICD-10-CM

## 2013-06-10 MED ORDER — DABIGATRAN ETEXILATE MESYLATE 150 MG PO CAPS: 150.0000 mg | ORAL_CAPSULE | Freq: Two times a day (BID) | ORAL | Status: DC

## 2013-06-10 MED ORDER — DABIGATRAN ETEXILATE MESYLATE 75 MG PO CAPS: 75.0000 mg | ORAL_CAPSULE | Freq: Two times a day (BID) | ORAL | Status: AC

## 2013-06-10 NOTE — Assessment & Plan Note (Signed)
Despite our best efforts, she does not want to quit smoking. Mother lived until 84 and smoked the entire time

## 2013-06-10 NOTE — Assessment & Plan Note (Addendum)
Long discussion with her today about anticoagulation. She does not want warfarin. She is willing to try pradaxa 75 mg twice a day. We will start a low dose to make sure she does not have any bleeding. She is high risk as she is 79 years old. If no complications, we could potentially increase to 150 mg twice a day at a later date. Coupon card was provided for one free month.

## 2013-06-10 NOTE — Assessment & Plan Note (Signed)
Likely secondary to underlying COPD and continued smoking

## 2013-06-10 NOTE — Assessment & Plan Note (Signed)
Neck or cough on today's visit. Possible chronic bronchitis. She's not bothered by the cough

## 2013-06-10 NOTE — Assessment & Plan Note (Signed)
Doing well on her Lasix twice a day dosing. Also takes potassium. No further changes to her medications

## 2013-06-10 NOTE — Progress Notes (Signed)
Patient ID: Kristina Navarro, female    DOB: 1925-10-04, 79 y.o.   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 She was noted to be in atrial fibrillation with RVR, rate 140 beats per minute. BNP on admission was 3000 small bowel bleed in 2011  In followup today, she reports that she is doing well. Continues to smoke, chronic bronchitis. She does have significant stress at home having to do with cutting trees that border her fence line, car trouble. She takes Lasix 40 mg twice a day with no significant leg edema. Balance is okay, no recent falls. Trace leg edema, wears compression hose daily.    Anticoagulation for atrial fibrillation was discussed with her on her last clinic visit and she indicated she  not did want warfarin for atrial fibrillation. We have raised his discussion again and she is more receptive today. She does not want a stroke .she does not want warfarin, willing to consider one of the other agents . She is otherwise active and has no complaints   Notes from May 2013 indicate she is not on Coumadin or anticoagulation due to her choice and history of GI bleed in the past, notes indicating small bowel bleed in 2011  Last echocardiogram 12/15/2011 showing ejection fraction 40-45%, LVH, moderate to severe TR, moderate MR, right trigger systolic pressure 39 mmHg Cholesterol 148, LDL 71, HDL 57 in October 2012   EKG shows atrial fibrillation with ventricular rate 70 beats per minute with old anterior MI, left anterior  fascicular block, nonspecific ST abnormality in the anterolateral leads    Outpatient Encounter Prescriptions as of 06/10/2013  Medication Sig Dispense Refill  . aspirin 81 MG tablet Take 81 mg by mouth every other day.      . calcipotriene (DOVONOX) 0.005 % cream Apply topically 2 (two) times daily.      . Cholecalciferol (VITAMIN D3) 400 UNITS CAPS Take 400 Units by mouth daily.       . digoxin (LANOXIN) 0.125 MG tablet Take 125 mcg by mouth daily.      Marland Kitchen diltiazem (DILACOR XR) 240 MG 24 hr capsule Take 240 mg by mouth daily.      . furosemide (LASIX) 40 MG tablet Take 1 tablet (40 mg total) by mouth 2 (two) times daily.  75 tablet  3  . Ipratropium-Albuterol (COMBIVENT IN) Inhale 2 puffs into the lungs as needed.      . metoprolol (LOPRESSOR) 50 MG tablet Take 50 mg by mouth 2 (two) times daily.      . Multiple Vitamin (MULTIVITAMIN) tablet Take 1 tablet by mouth 2 (two) times daily.       . NON FORMULARY Mineral capsule twice daily.      . NON FORMULARY Bone support capsule daily.      . NON FORMULARY Zygest capsule daily.      . potassium chloride SA (K-DUR,KLOR-CON) 20 MEQ tablet Take 20 mEq by mouth daily.      . raloxifene (EVISTA) 60 MG tablet Take 60 mg by mouth daily.  No facility-administered encounter medications on file as of 06/10/2013.     Review of Systems  Constitutional: Negative.   HENT: Negative.   Eyes: Negative.   Respiratory: Positive for shortness of breath.   Cardiovascular: Positive for leg swelling.  Gastrointestinal: Negative.   Endocrine: Negative.   Musculoskeletal: Positive for gait problem.  Skin: Negative.   Allergic/Immunologic: Negative.   Neurological: Negative.   Hematological: Negative.   Psychiatric/Behavioral: Negative.   All other systems reviewed and are negative.   BP 112/60  Pulse 70  Ht 5' 4.5" (1.638 m)  Wt 131 lb 2 oz (59.478 kg)  BMI 22.17 kg/m2  Physical Exam  Nursing note and vitals reviewed. Constitutional: She  is oriented to person, place, and time. She appears well-developed and well-nourished.  HENT:  Head: Normocephalic.  Nose: Nose normal.  Mouth/Throat: Oropharynx is clear and moist.  Eyes: Conjunctivae are normal. Pupils are equal, round, and reactive to light.  Neck: Normal range of motion. Neck supple. No JVD present.  Cardiovascular: Normal rate, S1 normal, S2 normal and intact distal pulses.  An irregularly irregular rhythm present. Exam reveals no gallop and no friction rub.   Murmur heard.  Systolic murmur is present with a grade of 2/6  Trace bilateral lower extremity edema with compression hose in place  Pulmonary/Chest: Effort normal. No respiratory distress. She has decreased breath sounds. She has no wheezes. She has no rales. She exhibits no tenderness.  Abdominal: Soft. Bowel sounds are normal. She exhibits no distension. There is no tenderness.  Musculoskeletal: Normal range of motion. She exhibits no edema and no tenderness.  Lymphadenopathy:    She has no cervical adenopathy.  Neurological: She is alert and oriented to person, place, and time. Coordination normal.  Skin: Skin is warm and dry. No rash noted. No erythema.  Psychiatric: She has a normal mood and affect. Her behavior is normal. Judgment and thought content normal.    Assessment and Plan

## 2013-06-10 NOTE — Telephone Encounter (Signed)
Notified Kristina Navarro per Dr. Mariah Milling needs to start off slowly for her age on the Pradaxa. Told her to put the samples that was given today of  Pradaxa 150 mg to the side and not to take until further notice. The patient understands instructions and will pick up a new Rx at [facility] of Pradaxa 75 mg take one tablet twice a day.

## 2013-06-10 NOTE — Patient Instructions (Addendum)
You are doing well. Please start pradaxa twice for blood thinning so you do not have a stroke  Please call us if you have new issues that need to be addressed before your next appt.  Your physician wants you to follow-up in: 6 months.  You will receive a reminder letter in the mail two months in advance. If you don't receive a letter, please call our office to schedule the follow-up appointment.

## 2013-07-06 IMAGING — CR DG CHEST 2V
1 series · 2 of 2 positions shown · non-contrast
Comparison: none

REASON FOR EXAM: productive cough, weakness, fever
COMMENTS:

PROCEDURE:     DXR - DXR CHEST PA (OR AP) AND LATERAL  - January 20, 2013 [DATE]
RESULT:     History: Productive cough.
Comparison Study: Prior chest x-ray of 01/17/2012.

[Series 1: ap · 0.17mm/px · 2 of 2 slices shown]
[im 1/2]
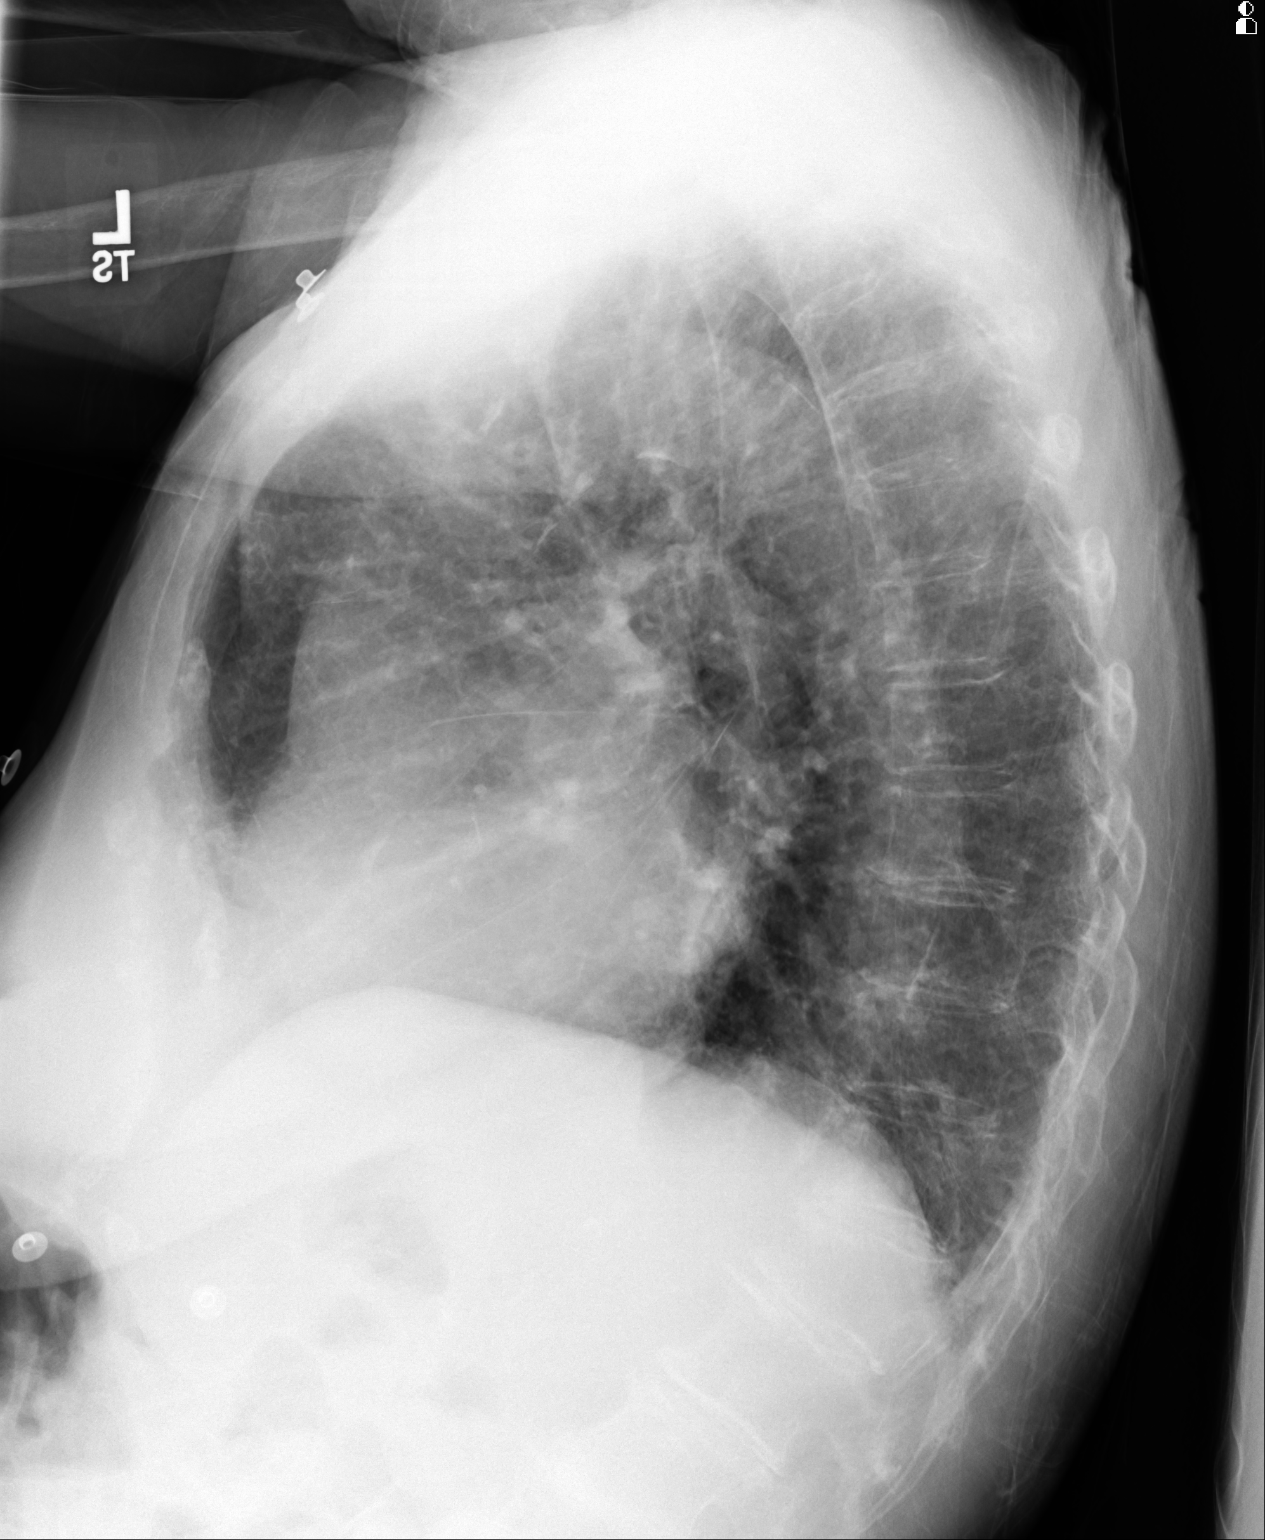
[im 2/2]
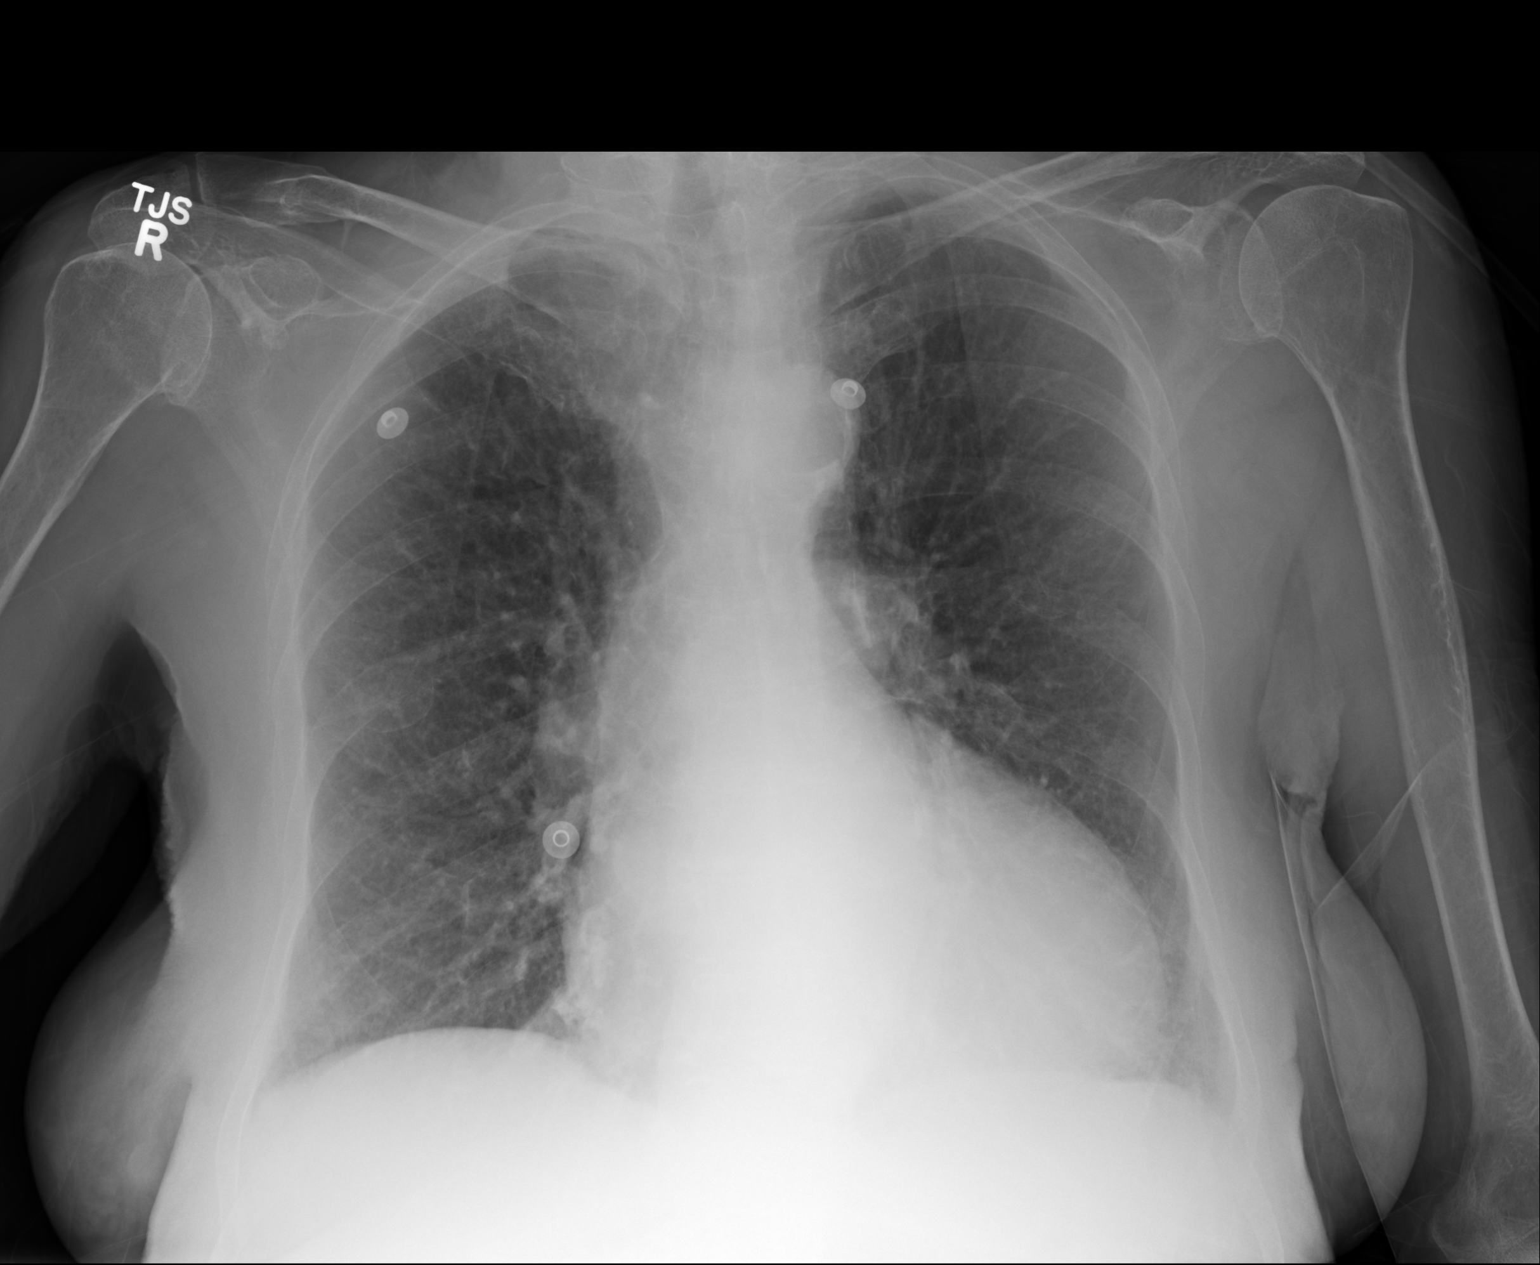

[2 of 2 positions shown; findings below may reference images not displayed]

FINDINGS: Mediastinum and hilar structures normal. Lungs are clear.
Cardiomegaly with normal pulmonary vascularity. Previously identified left
pleural effusion 01/17/2012 is no longer present. Degenerative changes
thoracic spine and both shoulders. Mild lower thoracic spine vertebral body
compression, this is stable.
IMPRESSION: Stable cardiomegaly. No acute abnormality.

## 2013-07-07 IMAGING — CT CT CHEST W/ CM
1 series · 15 of 33 positions shown, 19 images · non-contrast
Comparison: none

REASON FOR EXAM: cough, fever, dyspnea, hypoxia
COMMENTS:

[Series 2: soft tissue · axial · 0.64mm/px · z∈[-572,-284]mm · 15 of 114 slices shown, 19 images]
[im 9/114  mediastinal]
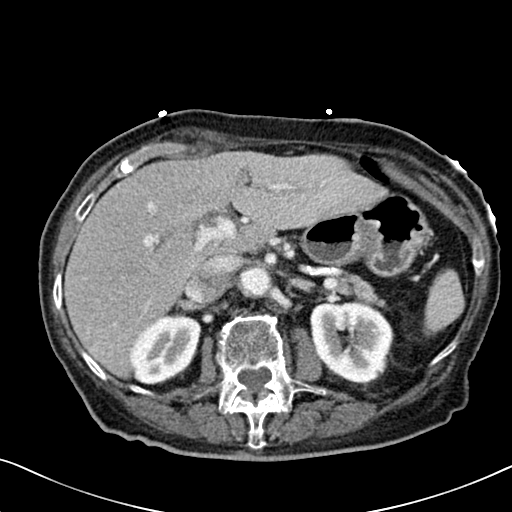
[im 9/114  lung]
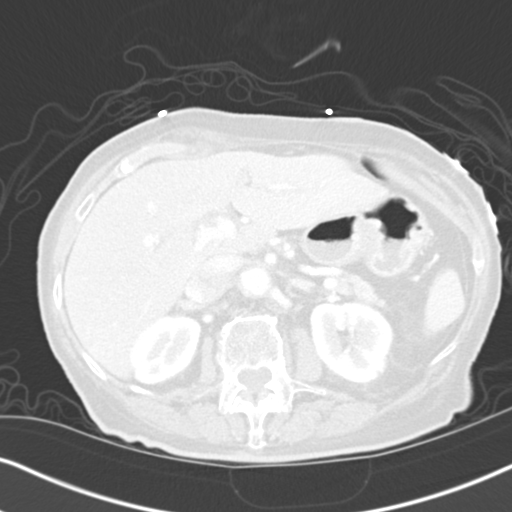
[im 17/114  lung]
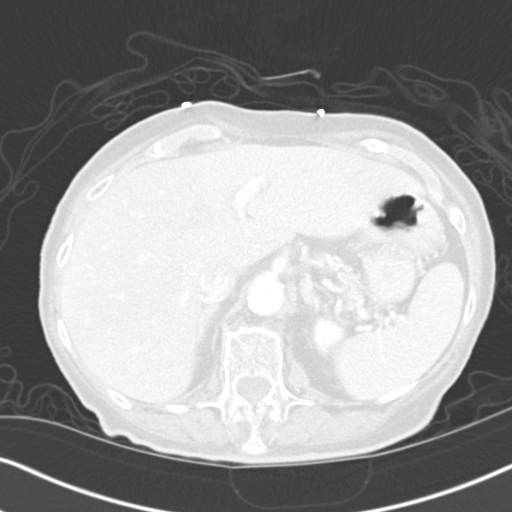
[im 23/114  lung]
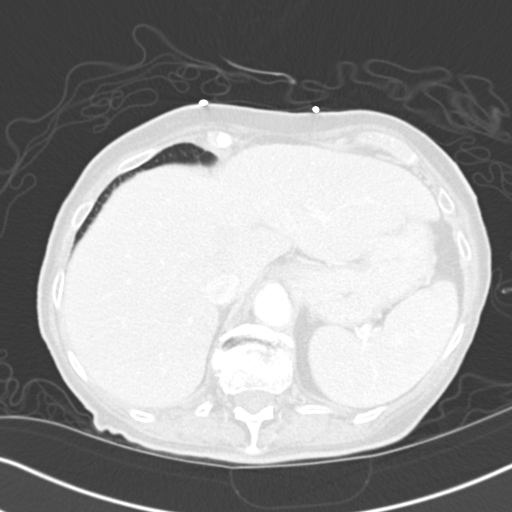
[im 30/114  lung]
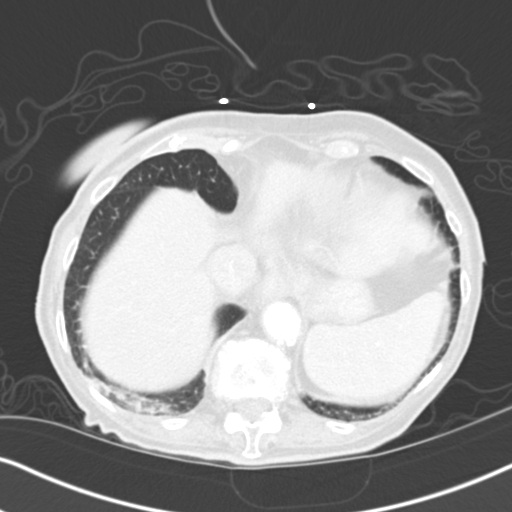
[im 38/114  mediastinal]
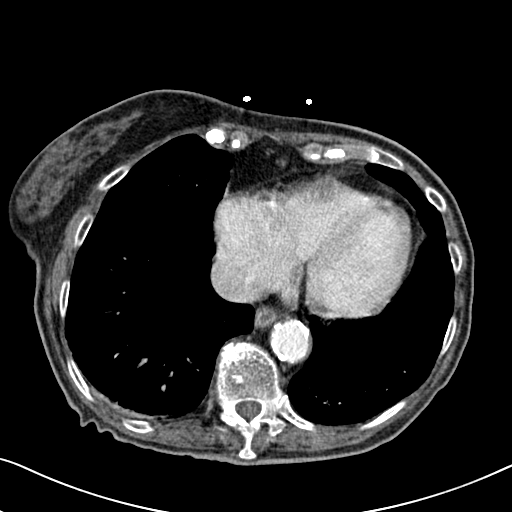
[im 38/114  lung]
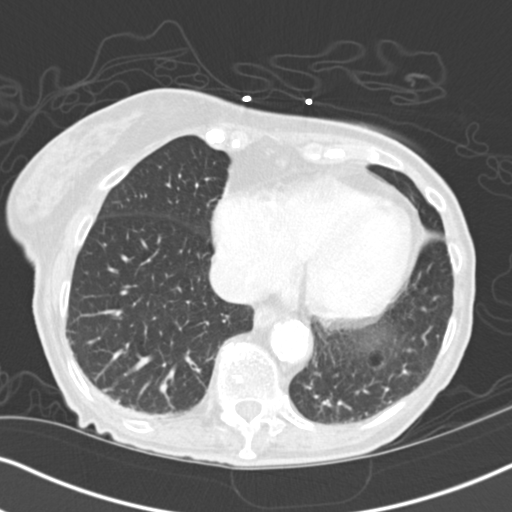
[im 46/114  lung]
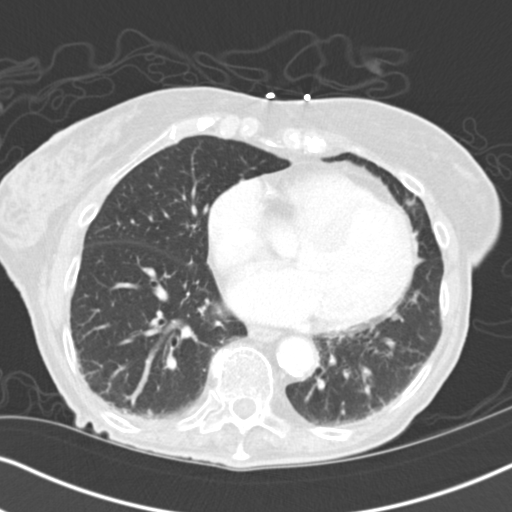
[im 51/114  lung]
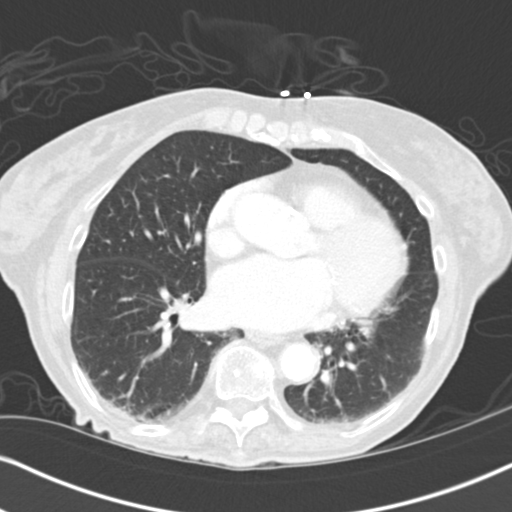
[im 59/114  lung]
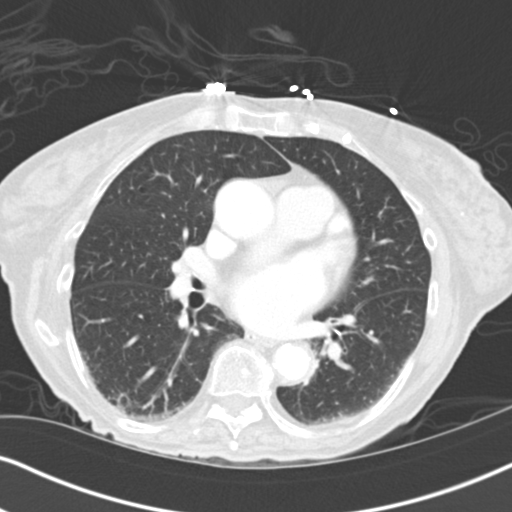
[im 63/114  mediastinal]
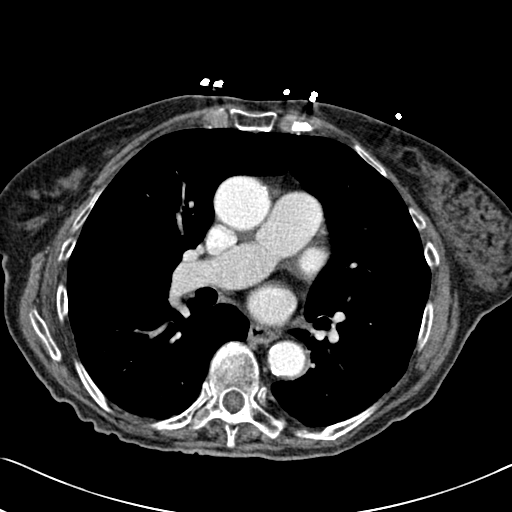
[im 63/114  lung]
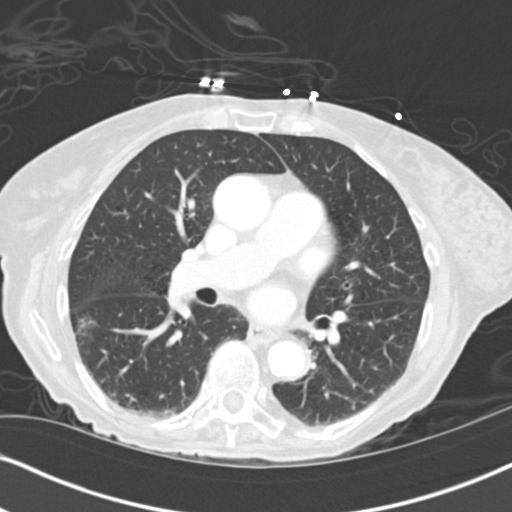
[im 68/114  lung]
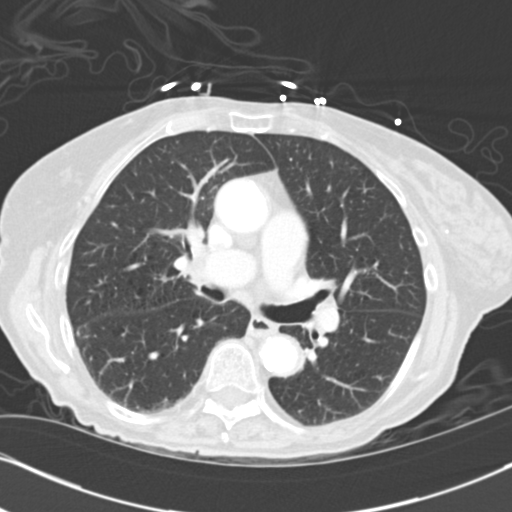
[im 76/114  lung]
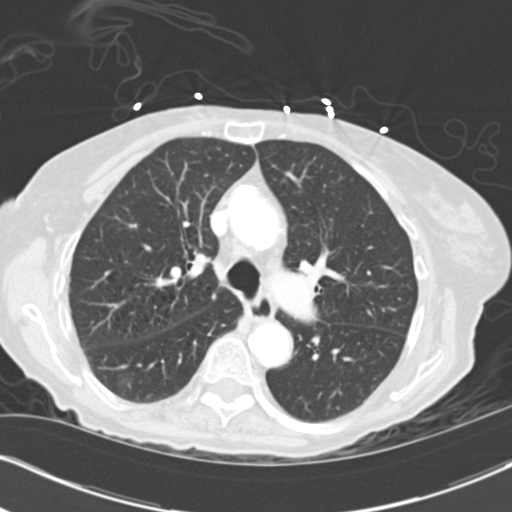
[im 84/114  lung]
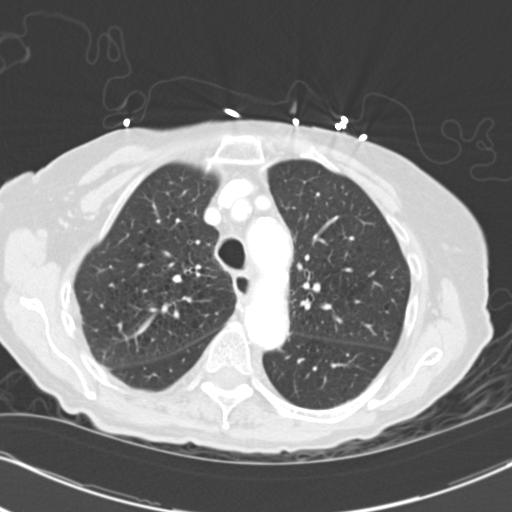
[im 91/114  mediastinal]
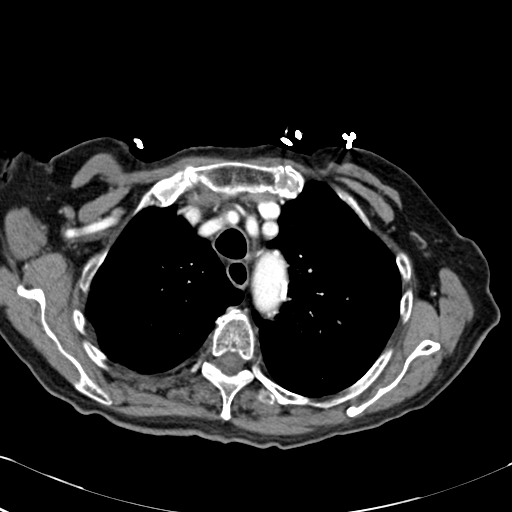
[im 91/114  lung]
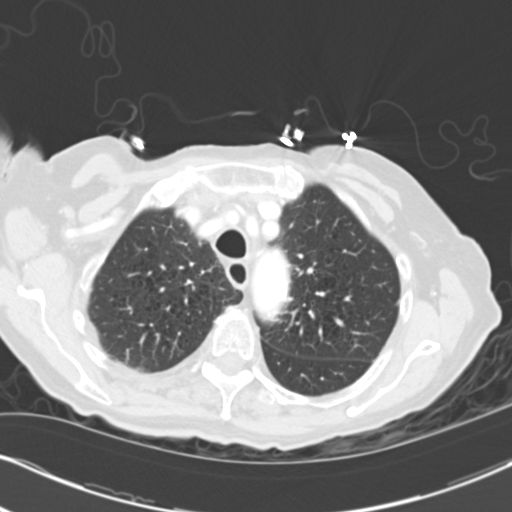
[im 97/114  lung]
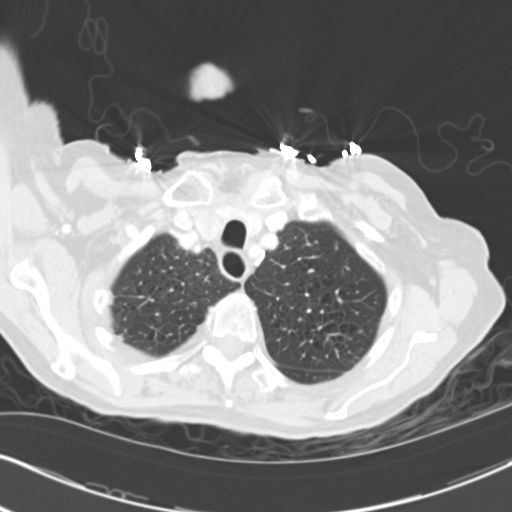
[im 105/114  lung]
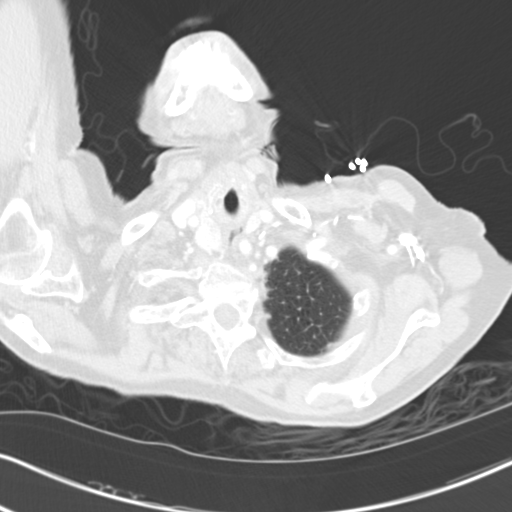

[15 of 33 positions shown; findings below may reference images not displayed]

PROCEDURE:     CT  - CT CHEST WITH CONTRAST  - January 21, 2013 [DATE]

RESULT:     Axial CT scanning was performed through the chest with
reconstructions in 3 mm intervals and slice thicknesses following
intravenous administration of 75 cc of [id number]. Review of multiplanar
reconstructed images was performed separately on the VIA monitor.

The cardiac chambers are enlarged. The caliber of the thoracic aorta is
normal. There is no evidence of a false lumen. The contrast within the
central pulmonary arterial tree is grossly normal. More peripherally the
contrast bolus is less than ideal but no filling defects are demonstrated.
There is no pleural nor pericardial effusion. No pathologic sized
mediastinal or hilar lymph nodes are present. There is enlargement of the
right thyroid lobe with intrathoracic extension.

At lung window settings there are mild emphysematous changes bilaterally.
There is subsegmental atelectasis posterior and in the lower lobes
especially on the right. No alveolar infiltrate is demonstrated. No
suspicious pulmonary masses are demonstrated.

Within the upper abdomen the observed portions of the liver and spleen
appear normal. There are no adrenal masses.

The lumbar vertebral bodies are preserved in height. The observed portions
of the ribs exhibit no acute fractures.
IMPRESSION: 1. There is no evidence of acute thoracic aortic pathology nor central
pulmonary emboli.
2. The cardiac chambers are mildly enlarged. There is no significant
pulmonary interstitial edema.
3. There are emphysematous changes in both lungs and there is subsegmental
atelectasis in the lower lobes especially on the right.
4. There is thyromegaly involving the right lobe.

A preliminary report was sent to the [HOSPITAL] the conclusion
of the study.

[REDACTED]

## 2013-07-12 IMAGING — US ABDOMEN ULTRASOUND LIMITED
1 series · 14 of 25 positions shown · non-contrast
Comparison: 

REASON FOR EXAM: thrombocytopenia, r/o splenomegaly
COMMENTS:

PROCEDURE:     US  - US ABDOMEN LIMITED SURVEY  - January 26, 2013  [DATE]
RESULT:     Right or quadrant abdominal ultrasound dated
TECHNIQUE: Real time sonographic imaging of the right quadrant was obtained.
Static representative images were provided for interpretation.

[Series 1: abdomen ultrasound limited · 0.23mm/px · 14 of 37 slices shown]
[im 1/37]
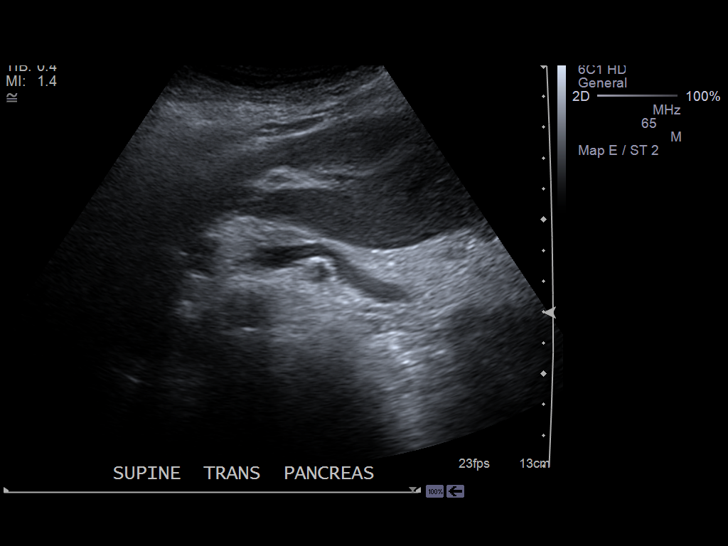
[im 4/37]
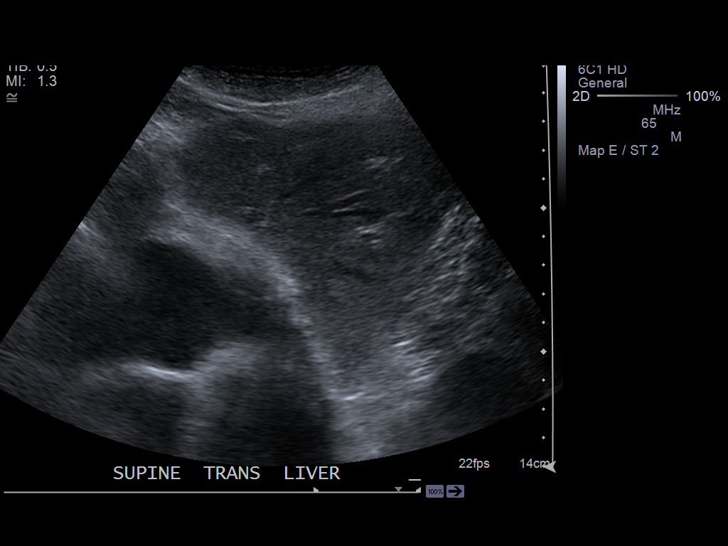
[im 7/37]
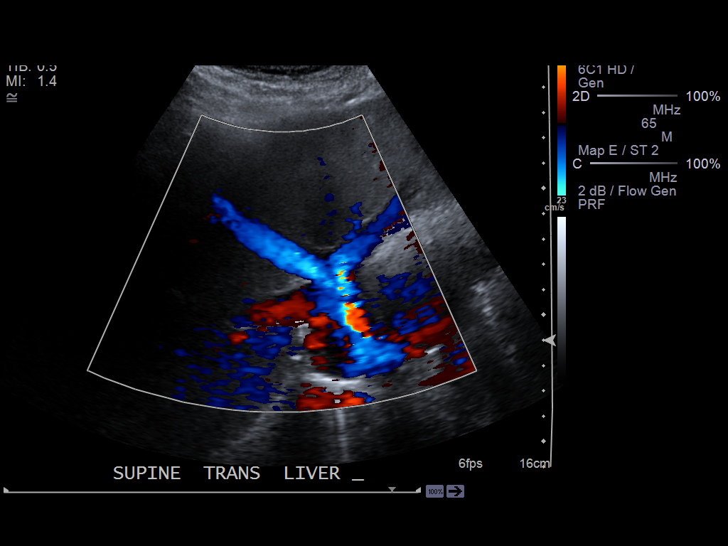
[im 10/37]
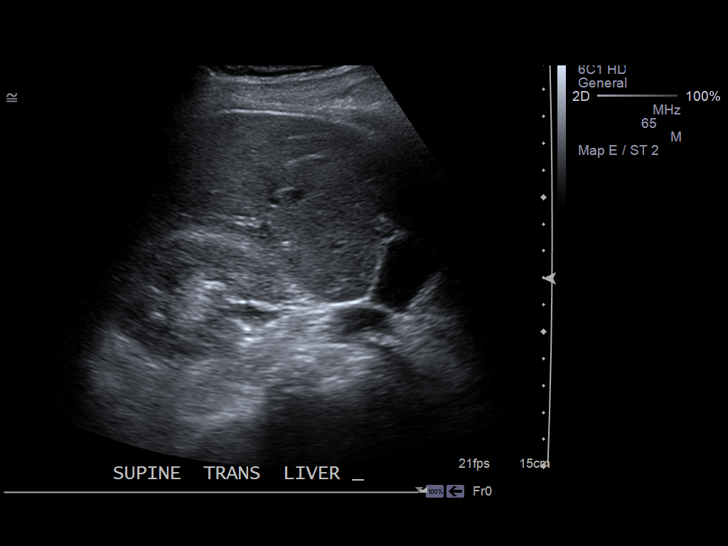
[im 13/37]
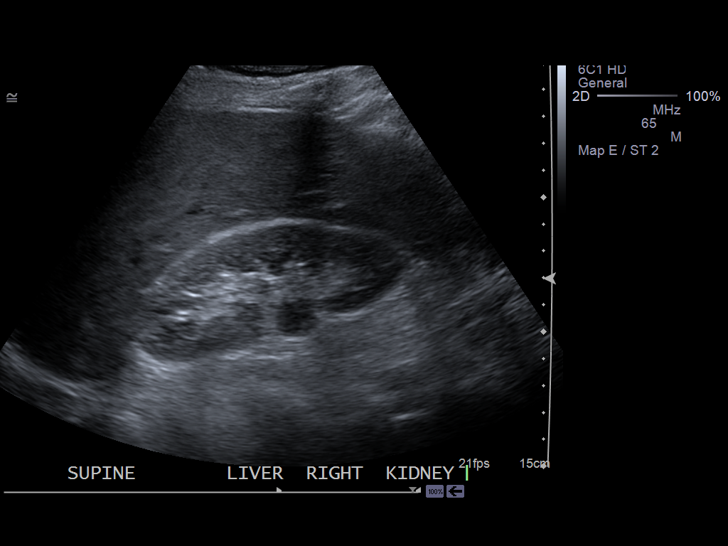
[im 14/37]
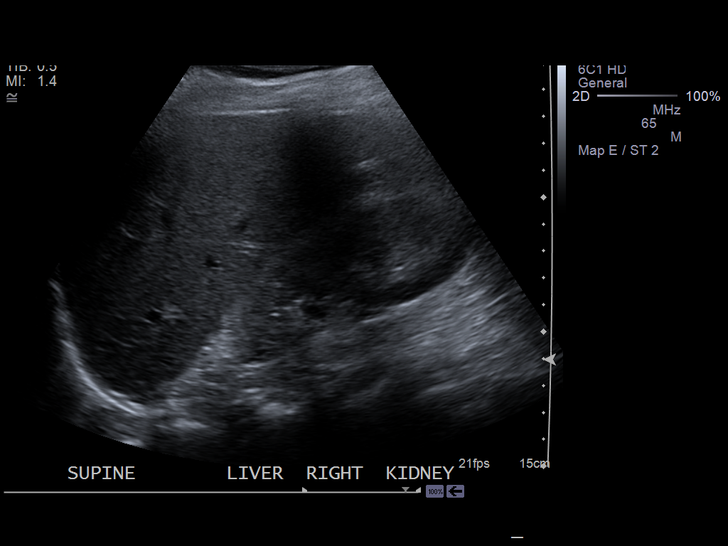
[im 17/37]
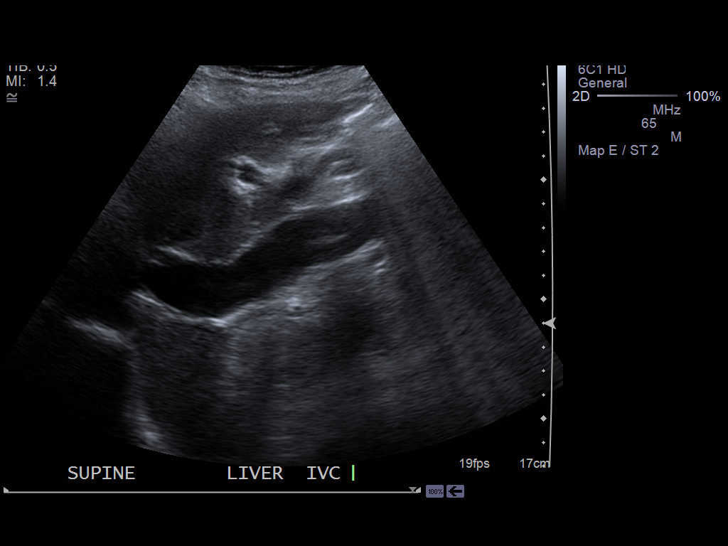
[im 20/37]
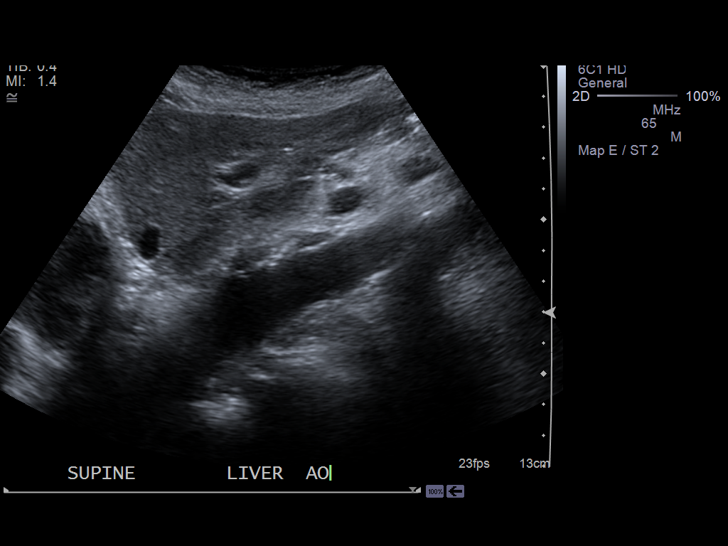
[im 23/37]
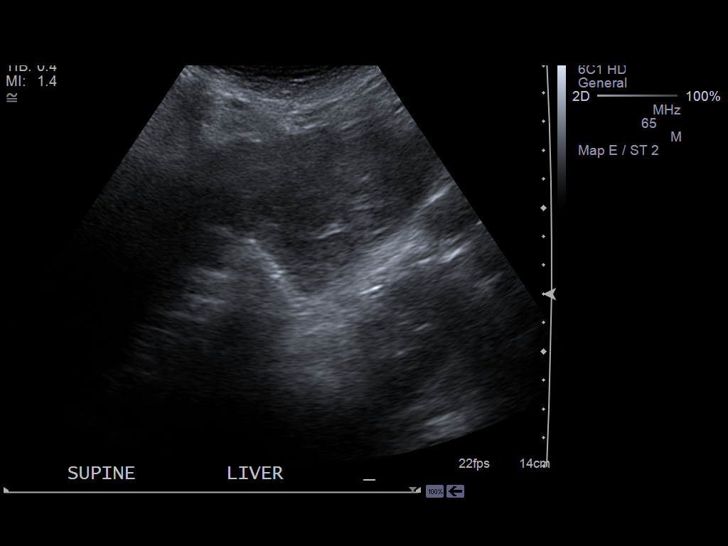
[im 25/37]
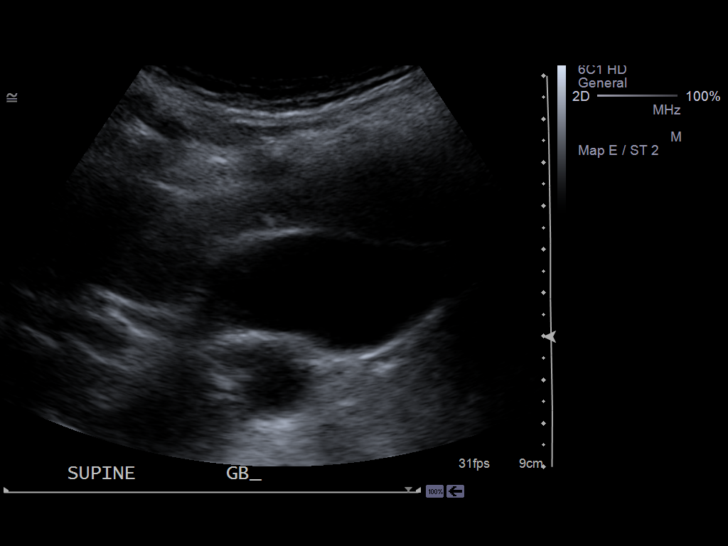
[im 28/37]
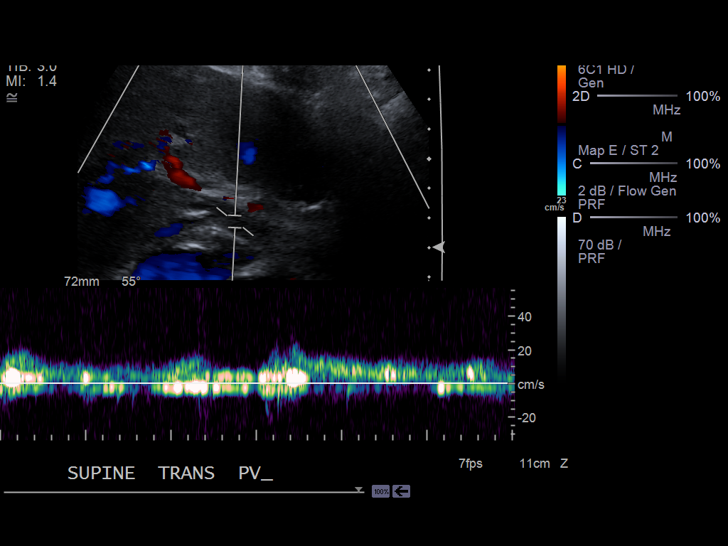
[im 31/37]
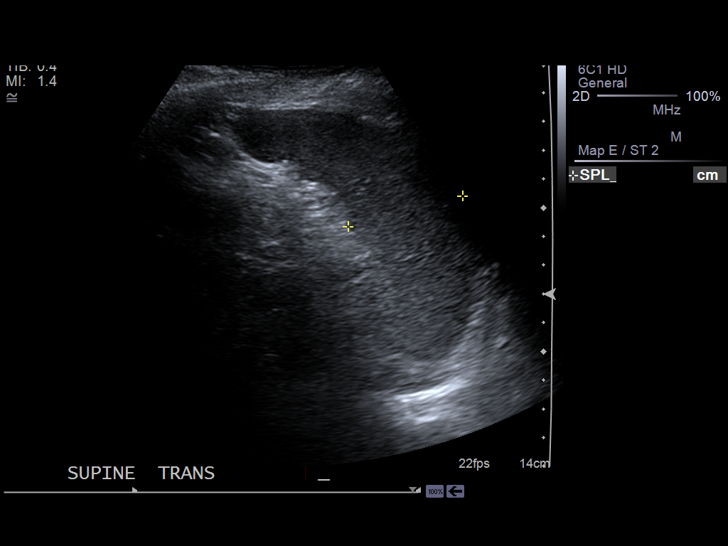
[im 34/37]
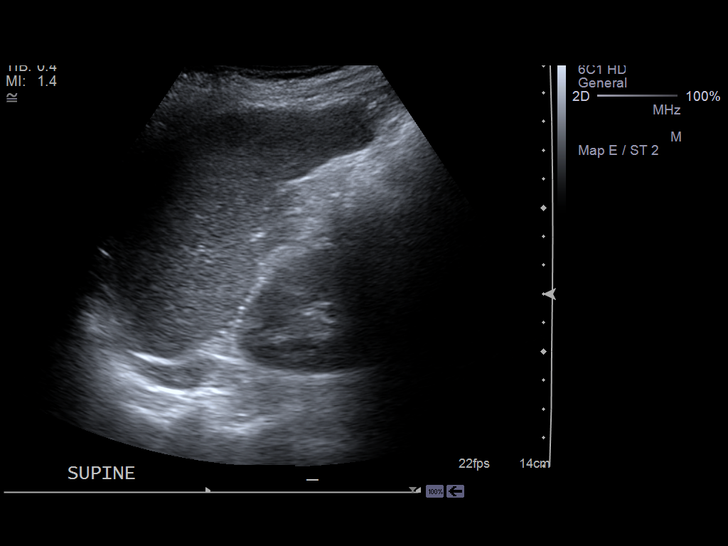
[im 37/37]
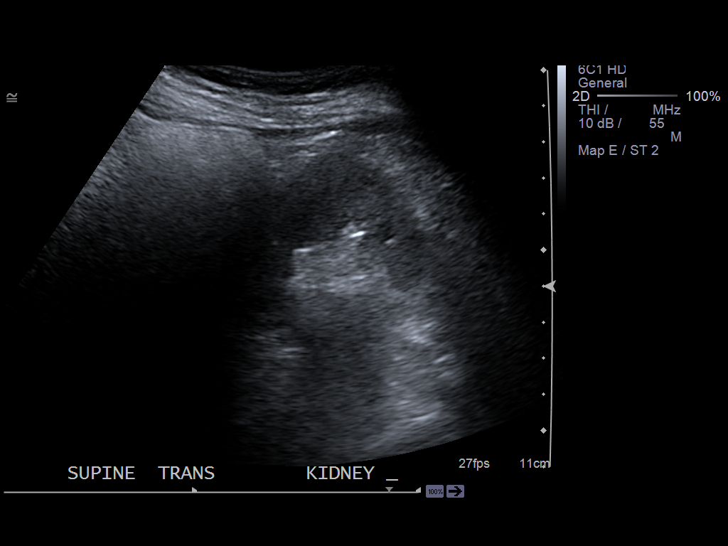

[14 of 25 positions shown; findings below may reference images not displayed]

FINDINGS: The liver is unremarkable. Hepatopedal flow is identified within
the portal vein.

There is no evidence of pericholecystic fluid, gallstones, sludging,
gallbladder wall thickening, nor common bile duct dilation. The gallbladder
wall is unremarkable and the common bile duct measures  3.1 mm in diameter.
The technologist was unable to elicit a sonographic Murphy's sign. The
pancreas is unremarkable.
IMPRESSION: No sonographic evidence of cholecystitis nor cholelithiasis.

## 2013-08-04 ENCOUNTER — Encounter: Payer: Self-pay | Admitting: Cardiovascular Disease

## 2013-08-04 ENCOUNTER — Ambulatory Visit (INDEPENDENT_AMBULATORY_CARE_PROVIDER_SITE_OTHER): Payer: Medicare Other | Admitting: Cardiovascular Disease

## 2013-08-04 ENCOUNTER — Telehealth: Payer: Self-pay | Admitting: *Deleted

## 2013-08-04 VITALS — BP 104/68 | HR 58 | Ht 64.5 in | Wt 141.2 lb

## 2013-08-04 DIAGNOSIS — I509 Heart failure, unspecified: Secondary | ICD-10-CM

## 2013-08-04 DIAGNOSIS — I4891 Unspecified atrial fibrillation: Secondary | ICD-10-CM

## 2013-08-04 DIAGNOSIS — I5042 Chronic combined systolic (congestive) and diastolic (congestive) heart failure: Secondary | ICD-10-CM

## 2013-08-04 MED ORDER — POTASSIUM CHLORIDE CRYS ER 20 MEQ PO TBCR: 20.0000 meq | EXTENDED_RELEASE_TABLET | Freq: Two times a day (BID) | ORAL | Status: DC

## 2013-08-04 MED ORDER — FUROSEMIDE 40 MG PO TABS: ORAL_TABLET | ORAL | Status: DC

## 2013-08-04 MED ORDER — DILTIAZEM HCL ER COATED BEADS 120 MG PO CP24: 120.0000 mg | ORAL_CAPSULE | Freq: Every day | ORAL | Status: AC

## 2013-08-04 NOTE — Telephone Encounter (Signed)
Patient coming to see Dr. Kirke Corin today. 08/04/13

## 2013-08-04 NOTE — Progress Notes (Signed)
HPI  Kristina Navarro is a very pleasant 79 year old woman who is a patient of Dr. Mariah Milling. She was added to my schedule due to worsening lower extremity edema. 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 She was noted to be in atrial fibrillation with RVR, rate 140 beats per minute. BNP on admission was 3000 small bowel bleed in 2011 Last echocardiogram 12/15/2011 showing ejection fraction 40-45%, LVH, moderate to severe TR, moderate MR, right trigger systolic pressure 39 mmHg Cholesterol 148, LDL 71, HDL 57 in October 2012 She has noticed worsening lower extremity edema with fluid leaking from her left leg. There has been a 10 pound weight gain according to our scale since her last visit. Dyspnea is slightly worse. She has been taking Lasix 40 mg twice daily.  Allergies  Allergen Reactions  . Penicillins   . Sulfa Antibiotics      Current Outpatient Prescriptions on File Prior to Visit  Medication Sig Dispense Refill  . aspirin 81 MG tablet Take 81 mg by mouth every other day.      . calcipotriene (DOVONOX) 0.005 % cream Apply topically 2 (two) times daily.      . Cholecalciferol (VITAMIN D3) 400 UNITS CAPS Take 400 Units by mouth daily.       . dabigatran (PRADAXA) 75 MG CAPS capsule Take 1 capsule (75 mg total) by mouth every 12 (twelve) hours.  60 capsule  6  . digoxin (LANOXIN) 0.125 MG tablet Take 125 mcg by mouth daily.      Marland Kitchen diltiazem (DILACOR XR) 240 MG 24 hr capsule Take 240 mg by mouth daily.      . furosemide (LASIX) 40 MG tablet Take 1 tablet (40 mg total) by mouth 2 (two) times daily.  75 tablet  3  . Ipratropium-Albuterol (COMBIVENT IN)  Inhale 2 puffs into the lungs as needed.      . metoprolol (LOPRESSOR) 50 MG tablet Take 50 mg by mouth 2 (two) times daily.      . Multiple Vitamin (MULTIVITAMIN) tablet Take 1 tablet by mouth 2 (two) times daily.       . NON FORMULARY Mineral capsule twice daily.      . NON FORMULARY Bone support capsule daily.      . NON FORMULARY Zygest capsule daily.      . potassium chloride SA (K-DUR,KLOR-CON) 20 MEQ tablet Take 20 mEq by mouth daily.      . raloxifene (EVISTA) 60 MG tablet Take 60 mg by mouth daily.       No current facility-administered medications on file prior to visit.     Past Medical History  Diagnosis Date  . Peripheral neuropathy   . Hypertension   . Arrhythmia     chronic atrial a-fib  . History of uterine cancer   . CHF (congestive heart failure) 01/2012    acute, new onset systolic. EF 45%  . Tobacco abuse     chronic  . COPD (chronic obstructive pulmonary disease)      Past Surgical History  Procedure Laterality Date  . Hip fracture surgery  August 2010    s/p left hip  . Colonoscopy  August 2011  .  Abdominal hysterectomy    . Exploratory      laparotomy  . Mohs surgery  2013    Left LE @  [facility]  . Skin cancer removal       Family History  Problem Relation Age of Onset  . Family history unknown: Yes     History   Social History  . Marital Status: Widowed    Spouse Name: N/A    Number of Children: N/A  . Years of Education: N/A   Occupational History  . Not on file.   Social History Main Topics  . Smoking status: Current Every Day Smoker -- 0.25 packs/day    Types: Cigarettes  . Smokeless tobacco: Not on file  . Alcohol Use: 0.6 oz/week    1 Glasses of wine per week     Comment: nightly  . Drug Use: No  . Sexual Activity: Not on file   Other Topics Concern  . Not on file   Social History Narrative  . No narrative on file     PHYSICAL EXAM   BP 104/68  Pulse 58  Ht 5' 4.5" (1.638 m)  Wt 141 lb 4 oz (64.071 kg)  BMI  23.88 kg/m2 Constitutional: She is oriented to person, place, and time. She appears well-developed and well-nourished. No distress.  HENT: No nasal discharge.  Head: Normocephalic and atraumatic.  Eyes: Pupils are equal and round. No discharge.  Neck: Normal range of motion. Neck supple. Mild JVD. No thyromegaly present.  Cardiovascular: Normal rate, regular rhythm, normal heart sounds. Exam reveals no gallop and no friction rub. No murmur heard.  Pulmonary/Chest: Effort normal and breath sounds normal. No stridor. No respiratory distress. She has no wheezes. She has no rales. She exhibits no tenderness.  Abdominal: Soft. Bowel sounds are normal. She exhibits no distension. There is no tenderness. There is no rebound and no guarding.  Musculoskeletal: Normal range of motion. She exhibits +2 edema and no tenderness.  Neurological: She is alert and oriented to person, place, and time. Coordination normal.  Skin: Skin is warm and dry. Chronic stasis dermatitis. She is not diaphoretic. No erythema. No pallor.  Psychiatric: She has a normal mood and affect. Her behavior is normal. Judgment and thought content normal.     EKG: Atrial fibrillation with slow ventricular rate. Nonspecific IVCD.   ASSESSMENT AND PLAN

## 2013-08-04 NOTE — Patient Instructions (Signed)
Your physician has recommended you make the following change in your medication:  Decrease Diltiazem 120 mg daily Take 80 mg of lasix in the morning and 40 mg in the afternoon  Increase potassium 20 mg twice a day    Your physician recommends that you return for lab work on Friday 08/07/13 at the [facility]

## 2013-08-04 NOTE — Telephone Encounter (Signed)
Patient called complaining of feet swelling. Please advise

## 2013-08-04 NOTE — Telephone Encounter (Signed)
Spoke w/ Kristina Navarro.  She reports that her left leg is swelling and leaking fluid.   Symptoms present for 2 weeks and "I know I should have come in before now, but I need the first available appt." Offered Kristina Navarro appt to see Dr. Kirke Corin this afternoon.  Kristina Navarro sched to see Dr. Kirke Corin this afternoon at 2:45.

## 2013-08-05 NOTE — Assessment & Plan Note (Signed)
The patient appears to be fluid overloaded with worsening edema and weight gain. I recommend increasing the dose of Lasix to 80 mg in the morning and 40 mg in the afternoon. Increase potassium supplement and check basic metabolic profile on Friday.  Also she is on diltiazem which might be contributing to her lower extremity edema. I recommend gradually stopping this medication. I decreased it to 120 mg once daily. If additional rate control is needed, the dose of metoprolol can be increased especially that the ejection fraction is not normal.

## 2013-08-05 NOTE — Assessment & Plan Note (Signed)
Continue rate control. The rate seems to be slow. The dose of diltiazem was decreased as outlined above. She is on long-term anticoagulation with Pradaxa. It is not entirely clear why she is also on aspirin 81 mg daily. This should be discontinued if there is no strong indication.

## 2013-08-07 ENCOUNTER — Other Ambulatory Visit: Payer: Self-pay | Admitting: Cardiovascular Disease

## 2013-08-07 ENCOUNTER — Other Ambulatory Visit: Payer: Medicare Other

## 2013-08-07 LAB — BASIC METABOLIC PANEL
BUN: 11 mg/dL (ref 7–18)
Calcium, Total: 8.4 mg/dL — ABNORMAL LOW (ref 8.5–10.1)
Chloride: 99 mmol/L (ref 98–107)
Co2: 32 mmol/L (ref 21–32)
Creatinine: 0.73 mg/dL (ref 0.60–1.30)
Glucose: 121 mg/dL — ABNORMAL HIGH (ref 65–99)
Osmolality: 274 (ref 275–301)
Sodium: 137 mmol/L (ref 136–145)

## 2013-08-11 ENCOUNTER — Other Ambulatory Visit: Payer: Self-pay | Admitting: *Deleted

## 2013-08-11 MED ORDER — POTASSIUM CHLORIDE CRYS ER 20 MEQ PO TBCR: 20.0000 meq | EXTENDED_RELEASE_TABLET | Freq: Every day | ORAL | Status: AC

## 2013-08-11 MED ORDER — FUROSEMIDE 40 MG PO TABS: 40.0000 mg | ORAL_TABLET | Freq: Two times a day (BID) | ORAL | Status: AC

## 2013-08-27 ENCOUNTER — Telehealth: Payer: Self-pay

## 2013-08-27 NOTE — Telephone Encounter (Signed)
Dr. Arlana Pouchate called inquiring as to when pt was last seen in the office and what recommended treatment was. Reports that pt was found deceased in her bed this am and is requesting info to help him complete death certificate. Faxed last office note to Dr. Maree Krabbeate's office at [phone].

## 2013-09-13 DEATH — deceased

## 2014-12-03 NOTE — Consult Note (Signed)
PATIENT NAME:  Kristina Navarro, Kristina Navarro MR#:  [id number] DATE OF BIRTH:  08-19-1925  DATE OF CONSULTATION:  01/21/2013  CONSULTING PHYSICIAN:  Knute Neu. Lorre Nick, MD  Kristina Navarro is an 81 year old patient who was admitted on June 11 and hematology was consulted. I saw and evaluated the patient. I placed a note on the chart on that day, although this full narrative is delayed until the current time. The patient with neutropenia and thrombocytopenia had a hematology consult. The patient was admitted with shortness of breath and productive cough and generalized weakness and had been out of breath for a few days. She had a temperature max apparently of 100 either at home or in the Emergency Room but was afebrile since admission. In the ER, she had evidence of COPD and was admitted for treatment of COPD, was found to have significant neutropenia and also thrombocytopenia but no bleeding.   PAST MEDICAL HISTORY: Atrial fibrillation, but not on anticoagulation due to other history of GI bleed. Peripheral neuropathy. Old left hip fracture. Systolic congestive heart failure. Reportedly chronic anemia, although the patient is not anemic currently. Hiatal hernia. Uterine cancer, had a hysterectomy but no other treatment. Has had skin cancers and psoriasis.   ALLERGIES: PENICILLIN AND SULFA.   MEDICATIONS: At the time of admission was on aspirin 81 mg daily, lisinopril 5 mg daily, diltiazem 240 daily, Lanoxin 0.125 daily, raloxifene 60 daily, metoprolol 50 twice a day, Combivent inhaler p.r.n., Lasix 40 mg twice a day, potassium 20 mEq daily and vitamin D 10,000 units weekly.   SOCIAL HISTORY: Smoker, 1 pack a day. Occasional social alcohol.   FAMILY HISTORY: Negative for malignancy. Included heart disease.   REVIEW OF SYSTEMS:  HEENT: On admission and when I saw the patient, she was not having and did not have any headache or dizziness. Has not had visual disturbances or ear or jaw pain or hearing loss.   RESPIRATORY: Did have, as in the current illness, the cough that was productive but no wheezing and no chest pain.  CARDIOVASCULAR: Was not having palpitations.  GASTROINTESTINAL: No abdominal pain, nausea, vomiting, diarrhea. No weight gain or loss.  GENITOURINARY: No dysuria or hematuria.  ENDOCRINE: No polyuria or polydipsia or cold intolerance.  NEUROLOGIC: No focal weakness or numbness in any of the extremities or history of seizure or stroke.  PSYCHIATRIC: No history of depression.   PHYSICAL EXAMINATION: GENERAL: The patient was alert and cooperative. Frail-appearing but no definite obvious pallor. No jaundice.  VITAL SIGNS: The temperature was already down from the T-max of 100.  MOUTH: No thrush.  HEART: Non-regular.  LUNGS: Decreased air entry in the right mid zone and base. There were moderately coarse rhonchi but, there was no wheezing at the time to my exam.  ABDOMEN: Nontender. No palpable mass or organomegaly.  EXTREMITIES: Had 1+ symmetric edema with minimal pitting. No cyanosis.  SKIN: Multiple psoriatic lesions. NEUROLOGIC: The patient was alert and cooperative, somewhat sluggish and generally weak but moved all extremities against gravity with no focal weakness and was alert.   LABORATORY AND RADIOLOGICAL DATA: Labs on admission included a creatinine of 0.69. Sodium was 32. Liver functions normal. Bilirubin was borderline at 1.1. Albumin was 3.4. White count was 2000 and a differential was not done initially, hemoglobin 15.4, platelets 83. The MCV was 94. Later on recheck, neutrophils of only 100. Urine was negative for nitrite or esterase. CT of the chest showed emphysema.   IMPRESSION AND PLAN: This is a patient with  chronic obstructive pulmonary disease exacerbation, is a smoker. Question of bronchitis or pneumonia. Had a productive cough minimally, had a minimal temperature, was hypokalemic, and so treated for chronic obstructive pulmonary disease and infection. From a  hematology point of view, severe neutropenia and modest thrombocytopenia, which is apparently a new finding. There is no anemia. Not clearly megaloblastic. The differential diagnosis would include a viral illness acute, acute bacterial illness, marrow suppression, although there is no clear significant viral illness. Differential would include possible drug effect. There is no history of any new medication that would really be implicated. Unlikely to be B12 with no megaloblastic changes. Most worrisome would be myelodysplastic syndrome or aleukemic leukemia. No risk factors for it but would always consider human immunodeficiency virus and hepatitis C.  With considerable concern about possible aleukemic leukemia, myelodysplastic syndrome, very severe cytopenia, I arranged for a bone marrow exam to be done the next day. The patient continued antibiotics with Levaquin. In the absence of fever or evidence of sepsis, even with neutropenia, it was okay to give single drug coverage, particularly with no foreign body, no port and PENICILLIN ALLERGY. Recommended that if there was additional fever spike or chills, then cultures would be repeated. Cefepime could be added for broad coverage. Neupogen could be given to boost the neutrophils. Followup with bone marrow was arranged for the next morning and then daily followup.    ____________________________ Knute Neuobert G. Lorre NickGittin, MD rgg:jm D: 01/23/2013 18:52:51 ET T: 01/23/2013 20:53:03 ET JOB#: [id number]  cc: Knute Neuobert G. Lorre NickGittin, MD, <Dictator> Marin RobertsOBERT G GITTIN MD ELECTRONICALLY SIGNED 01/30/2013 14:01

## 2014-12-03 NOTE — H&P (Signed)
PATIENT NAME:  Kristina Navarro, Kristina Navarro MR#:  [id number] DATE OF BIRTH:  04-21-1926  DATE OF ADMISSION:  01/21/2013  REFERRING PHYSICIAN: Dr. Marjean Donna.    PRIMARY CARE PHYSICIAN: Dr. Benita Stabile.    CHIEF COMPLAINT: Cough, shortness of breath.   HISTORY OF PRESENT ILLNESS: This is an 82 year old Caucasian female with known past medical history of systolic congestive heart failure, ejection fraction 40% to 45%, COPD, Afib not on anticoagulation due to history of GI bleed, tobacco abuse and still smokes 2 packs per day, presents with shortness of breath, cough, productive sputum and generalized weakness. The patient reports she has been feeling short of breath out of ordinary for the last few days but reported did get significantly worse over the last 24 hours where she developed productive sputum as well. As well, she complains of being afebrile, but the patient was afebrile in the ED, which prompted the patient to come to the ED. The patient had a CAT scan done which did show evidence of COPD picture but did not show any consolidation or infiltrate. The patient has history of COPD where she had an admission last year for COPD exacerbation where she was discharged on p.o. prednisone tapering dose. The patient is known to have history of Afib, where she is not on anticoagulation secondary to her history of GI bleed. The patient's EKG does show Afib which is rate controlled, with left bundle branch block which appears to be old. Hospitalist service was requested to admit the patient for further management and treatment of her COPD exacerbation.   PAST MEDICAL HISTORY:  1. Chronic Afib, not on anticoagulation due to history of GI bleed and patient preference.   2. Peripheral neuropathy.   3. Left hip fracture, status post surgery 2010.  4. Exploratory laparotomy following hysterectomy in 6712.  5. Systolic congestive heart failure with ejection fraction of 40% to 45%, with moderate to severe tricuspid  regurg, moderate mitral regurg.  6. Chronic anemia.  7. Hiatal hernia.   8. Chronic tobacco abuse.  9. COPD.  10. History of uterine cancer, status post hysterectomy.  11. History of skin cancer.  12. Psoriasis.   ALLERGIES: PENICILLIN AND SULFA.   HOME MEDICATIONS:  1. Aspirin 81 mg oral daily.  2. Lisinopril 5 mg oral daily.  3. Diltiazem 240 mg extended release daily.  4. Lanoxin 125 mcg oral daily.  5. Raloxifene 60 mg oral daily.  6. Metoprolol 50 mg oral 2 times a day.  7. Combivent as needed every 4 hours.  8. Lasix 40 mg oral 2 times a day.  9. Potassium 20 mEq oral daily.  10. Vitamin D3 10,000 international units weekly.   SOCIAL HISTORY: The patient lives at home by herself. She has health aid at home. She drinks wine occasionally. No drug use. The patient still smokes 1 pack per day.   FAMILY HISTORY: Father with coronary artery disease and MI.   REVIEW OF SYSTEMS:  CONSTITUTIONAL: The patient complains of fever, fatigue, weakness. Denies any weight gain, weight loss.  EYES: Denies blurry vision, double vision, pain, inflammation.  ENT: Denies tinnitus, ear pain, hearing loss or epistaxis.  RESPIRATORY: Complains of cough, dyspnea, productive sputum and known history of COPD.   CARDIOVASCULAR: Denies chest pain, edema, arrhythmia, palpitations, syncope.  GASTROINTESTINAL: Denies nausea, vomiting, diarrhea, abdominal pain, hematemesis,  GENITOURINARY: Denies dysuria, hematuria or renal colic.  ENDOCRINE: Denies polyuria, polydipsia, heat or cold intolerance.  HEMATOLOGY: Denies anemia, easy bruising, bleeding diathesis.  INTEGUMENTARY:  Has a history of psoriasis.   MUSCULOSKELETAL: Denies gout, cramps, neck pain.  NEUROLOGIC: Denies CVA, TIA, seizures, headache, ataxia, vertigo.  PSYCHIATRIC: Denies anxiety, insomnia, bipolar disorder, substance or alcohol abuse.   PHYSICAL EXAMINATION:  VITAL SIGNS: Temperature 100, pulse 82, respiratory rate 16, blood pressure  147/59, saturating 94% on oxygen.  GENERAL: Frail elderly female, looks comfortable, in no apparent distress.  HEENT: Head atraumatic, normocephalic. Pupils equal and reactive to light. Pink conjunctivae. Anicteric sclerae. Dry oral mucosa.  NECK: Supple. No thyromegaly. No JVD. No carotid bruits. No lymphadenopathy.  CARDIOVASCULAR: S1 and S2 heard. Irregularly irregular. Mild systolic ejection murmur.  LUNGS: Has fair air entry bilaterally with diffuse rales and scattered end expiratory wheezing. ABDOMEN: Soft, nontender, nondistended. Bowel sounds present. No organomegaly appreciated.  EXTREMITIES: Mild pitting edema bilaterally. No clubbing. No cyanosis. There is some erythema and chronic skin changes without any warmth. There are some chronic venous stasis changes as well. Dorsalis pedis pulse is decreased but it is felt bilaterally.  NEUROLOGICAL: Cranial nerves are grossly intact. Motor 5 out of 5 in all extremities.  PSYCHIATRIC: Awake, alert x3.  SKIN: Has multiple psoriatic lesions.    PERTINENT LABS: Glucose 154, BUN 12, creatinine 0.69, sodium 132, potassium 3.3, chloride 96, CO2 26. ALT 24, AST 30, alk phos 75, total bili 1.1, albumin 3.4, total protein 6.7. White blood cells 2, hemoglobin 15.4, hematocrit 46, platelets 83.   Urine negative nitrite and leukocyte esterase.   CT chest with contrast showing mild scarring and emphysematous changes in the apex. No acute pulmonary abnormality.   ASSESSMENT AND PLAN:  1. Chronic obstructive pulmonary disease exacerbation: The patient is known to have history of chronic obstructive pulmonary disease in the past. She is still smoking. She will be started on intravenous Solu-Medrol 25m every 8 hours. She will be on DuoNebs every 4 hours and given the fact she is having productive sputum and she is slightly febrile, I will start her on levofloxacin and will follow on the sputum cultures and will have her on p.r.n. oxygen to keep her oxygen  saturation around 95.  2. Tobacco abuse: The patient was counseled at length, but she is refusing to quit smoking at this point as well refusing NicoDerm patches.  3. Atrial fibrillation, rate controlled: Will continue on metoprolol, Cardizem and digoxin. Will check digoxin level. The patient is not on anticoagulation due to her history of gastrointestinal bleed.  4. History of congestive heart failure: Appears to be compensated at this point. Will continue her on home meds but will hold her Lasix as she appears to be clinically dehydrated. Will be resumed once she is more stable.  5. Hypokalemia: Will replace. Will recheck level in a.m.  6. Leukopenia and thrombocytopenia: It is unclear if the patient is having pancytopenia or not, even though her hemoglobin is within normal limits but this might be secondary to her chronic obstructive pulmonary disease which makes it elevated. Will consult oncology service.  7. Gastrointestinal prophylaxis: Will have the patient on Protonix, especially she is on intravenous steroids.  8. Deep vein thrombosis prophylaxis: Will have the patient on sequential compression device for deep vein thrombosis prophylaxis given her thrombocytopenia, so will avoid chemical anticoagulation.   CODE STATUS: FULL CODE.   TOTAL TIME SPENT ON ADMISSION AND PATIENT CARE: 55 minutes.   ____________________________ DAlbertine Patricia MD dse:gb D: 01/21/2013 03:46:57 ET T: 01/21/2013 04:04:42 ET JOB#: [id number] cc: DAlbertine Patricia MD, <Dictator> Sherian Valenza SGraciela HusbandsMD  ELECTRONICALLY SIGNED 01/29/2013 0:31

## 2014-12-03 NOTE — Consult Note (Signed)
Chief Complaint:  Subjective/Chief Complaint NO ACUTE COMPLAINTS  , NOT SOB OR COUGHING   VITAL SIGNS/ANCILLARY NOTES: **Vital Signs.:   13-Jun-14 18:11  Vital Signs Type Q 4hr  Temperature Temperature (F) 97.6  Celsius 36.4  Temperature Source oral  Pulse Pulse 60  Respirations Respirations 18  Systolic BP Systolic BP 161  Diastolic BP (mmHg) Diastolic BP (mmHg) 78  Mean BP 94  Pulse Ox % Pulse Ox % 94  Pulse Ox Activity Level  At rest  Oxygen Delivery Room Air/ 21 %   Brief Assessment:  GEN no acute distress   Respiratory normal resp effort  no use of accessory muscles   Additional Physical Exam ALERT AND COOPERATIVE, NEURO GROSSLY NON FOCAL NO THRUSH  NO BRUISING OR RASH   Lab Results: Routine Chem:  13-Jun-14 04:44   Magnesium, Serum 1.9 (1.8-2.4 THERAPEUTIC RANGE: 4-7 mg/dL TOXIC: > 10 mg/dL  -----------------------)  Result Comment WBC - RESULTS VERIFIED BY REPEAT TESTING.  - CRITICAL VALUE PREVIOUSLY NOTIFIED.  - TPL  Result(s) reported on 23 Jan 2013 at 06:22AM.  Glucose, Serum  166  BUN  19  Creatinine (comp) 0.84  Sodium, Serum 136  Potassium, Serum 4.3  Chloride, Serum 101  CO2, Serum 27  Calcium (Total), Serum 8.9  Anion Gap 8  Osmolality (calc) 278  eGFR (African American) >60  eGFR (Non-African American) >60 (eGFR values <27m/min/1.73 m2 may be an indication of chronic kidney disease (CKD). Calculated eGFR is useful in patients with stable renal function. The eGFR calculation will not be reliable in acutely ill patients when serum creatinine is changing rapidly. It is not useful in  patients on dialysis. The eGFR calculation may not be applicable to patients at the low and high extremes of body sizes, pregnant women, and vegetarians.)  Routine Hem:  13-Jun-14 04:44   WBC (CBC)  1.8  RBC (CBC) 4.99  Hemoglobin (CBC) 15.7  Hematocrit (CBC) 46.5  Platelet Count (CBC)  91  MCV 93  MCH 31.4  MCHC 33.7  RDW 14.4  Bands 4  Segmented  Neutrophils 26  Lymphocytes 40  Variant Lymphocytes 2  Monocytes 28 (Result(s) reported on 23 Jan 2013 at 06:22AM.)   Assessment/Plan:  Assessment/Plan:  Assessment SEE ALSO INITIAL CONSULT.  SIGNIFICANT NEUTROPENIA AND THROMBOCYTOPENIA, WITH DIFFERENTIAL INCLUDING INFECTIOUS OR VIRAL MARROW SUPRESSION, NO HX TO SUGGEST DRUG EFFECT, MDS, CONCERN OF ALEUKEMIC LEUKEMIA, SO THAT BM EXAM RECOMMENDED. FEBRILE REPORTED PRIOR TO ADMIT, HAS BEEN AFEBRILE SINCE ADMISSION... BM DONE 6/12  STABLE,  NO RESP SYMPTOMS.  CBC STABLE, PLTS AND WBC WAX AND WANE, NO DEFINITE IMPROVEMENT. B12 NORMAL, HIV NEG, HEP C PENDING    PLAN...... CONTINUE LEVAQUIN, F/U CULTURES, WATCH TEMP CURVE, IF SPIKE FEVER THEN RECULTURE AND ADD CEFAPIME AND NEUPOGEN, BUT NO INDICATION FOR ANY CHANGES CURRENTLY. AS PRIOR NOTED,PRELIMINARY REPORT, DICUSSED WITH PATHOLOGY, NO BLASTS SEEN, NO EVIDENCE OF ACUTE LEUKEMIA, CONSIDER IMAGING/U/S FOR SPLEEN SIZE, CHECK DAILY CBC   Electronic Signatures: GDallas Schimke(MD)  (Signed 13-Jun-14 18:43)  Authored: Chief Complaint, VITAL SIGNS/ANCILLARY NOTES, Brief Assessment, Lab Results, Assessment/Plan   Last Updated: 13-Jun-14 18:43 by GDallas Schimke(MD)

## 2014-12-03 NOTE — Consult Note (Signed)
Brief Consult Note: Diagnosis: NEUTROPENIA AND THROMBOCYTOPENIA  EXACCERBATION OF COPD HYPOXIA  HX OF COPD AND CHF.   Patient was seen by consultant.   Comments: SEE DICTATED NOTE TO FOLLOW  PATIENT SEEN CHART REVIEWED. VSS AFEBRIL HAS RECEIVED LEVAQUIN. HAS PCN ALLERGY, REPORTS RASH YEARS AGO.Marland Kitchen.  AFEBRILE.  HAS BEEN GIVEN STEROIDS PATIENT DID NOT HAVE CHILLS OR SWEATS AT HOME, SAYS HER TEMP WAS 100 IN ER. NO ACUTE DISTRESS WEAK, BUT SAYS STRONGER THAN LAST NIGHT. NO GROSS FOCAL WEAKNESS, RHONCHI RIGHT MID ZONE AND BASE, NO WHEEZING ABDO NON TENDER, NO PALPABLE MASS OR ORGANOMEGALY, PULSE NON REGULAR, EXT 1 PLUS SYMMETRIC EDEMA, SKIN MULTIPLE LESION PSORIASIS, NO PALPABLE LYMPH NODES NECK, SUPRACLAVICULAR , SUBMANDIBULAR AXILLA. LABS WBC 100 NEUTROPHILS, PLTS 53K.  HGB 15.8, MCV 94 RDW NORMAL.. IMP, SEVERE NEUTROPENIA, MODERATE THROMBOCYTOPENIA, NEW FINDINGS, NO CLEAR ANTECEDANT VIRAL ILLNESS OR SEVERE INFECTION, OR NEW MEDICATION OR ANTIBIOTIC. POSSIBLE CAUSES INCLUDE MDS OR ALEUKEMIC LEUKEMIA. UNLIKELY MEGALOBLASTIC  SINCE MCV, RDW NORMAL, AND HGB NORMAL.  PLAN RECOMMEND BM EXAM , HAVE DISCUSSED WITH PATIENT, HAVE ARRANGED FOR TOMORROW. WOULD ALSO CHECK IN AM REPEAT CBC, B12, HIV, HEP C. IF ANY FEVER OR CHILLS OR CLINICAL DECLINE, WOULD REECULTURE, ADD CEFIPIME FOR BROAD SPECTRUM COVERAGE, AND START NEUPOGEN.  Electronic Signatures: Marin RobertsGittin, Jadin Creque G (MD)  (Signed 11-Jun-14 15:56)  Authored: Brief Consult Note   Last Updated: 11-Jun-14 15:56 by Marin RobertsGittin, Teighan Aubert G (MD)

## 2014-12-03 NOTE — Discharge Summary (Signed)
PATIENT NAME:  Kristina Navarro, Kristina Navarro MR#:  [id number] DATE OF BIRTH:  [birth year]  DATE OF ADMISSION:  01/21/2013 DATE OF DISCHARGE:  01/26/2013  ADMITTING DIAGNOSES:  1.  Chronic obstructive pulmonary disease exacerbation. 2.  Tobacco abuse. 3.  Atrial fibrillation. 4.  Congestive heart failure. 5.  Leukopenia as well as thrombocytopenia.   DISCHARGE DIAGNOSES: 1.  Chronic obstructive pulmonary disease exacerbation.  2.  Acute bronchitis.  3.  Ongoing tobacco abuse.  4.  Generalized weakness due to infection.  5.  Hypokalemia, hypernatremia and dehydration, resolved on intravenous fluids.  6.  Neutropenia and thrombocytopenia, resolving.  Status post bone marrow biopsy on 01/22/2013 by Dr. Inez Pilgrim. No leukemia noted. Thought to be current infection related.  7.  History of congestive heart failure, systolic, with ejection fraction of 40% to 45%. 8.  History of chronic atrial fibrillation, not on anticoagulation due to gastrointestinal bleed.  9.  Peripheral neuropathy.  10.  Chronic anemia.  11.  Hiatal hernia. 12.  Chronic tobacco abuse.  13.  Chronic obstructive pulmonary disease.  14.  History of uterine cancer, status post hysterectomy, skin cancer as well as psoriasis.   DISCHARGE CONDITION: Stable.   DISCHARGE MEDICATIONS: The patient is to resume her outpatient medications which are:  1.  Metoprolol tartrate 50 mg p.o. twice daily. 2.  Combivent Respimat 2 puffs every 4 hours as needed.  3.  Potassium chloride 20 mEq once daily.  4.  Vitamin D3 10,000 units 1 capsule once weekly.  5.  Diltiazem 240 mg extended-release once daily.  6.  Aspirin 81 mg p.o. once daily. 7.  Lanoxin 125 mcg p.o. daily.  8.  Lisinopril 5 mg p.o. once daily.  9.  Meloxicam 60 mg p.o. once daily.  10.  Prednisone, new medication, 30 mg p.o. once on the 17th of June 2014 then taper x 10 mg daily until stopped.  11.  Lasix 40 mg p.o. once daily. This is a new dose.  In the past was 40 mg twice a day.   12.  Levofloxacin 250 mg p.o. once daily for 2 more days.  13.  Nicotine transdermal patch 21 mg transdermally once daily.  14.  Ensure 240 mg once daily in the evening.  The patient prefers chocolate.   HOME OXYGEN: None.   DIET: 2 grams salt, low fat, low cholesterol, regular consistency.   ACTIVITY LIMITATIONS: As tolerated.  The patient is on neutropenia precautions until the patient's white blood cell count, absolute neutrophil count, reaches 800. Continue neutropenic precautions.   DISCHARGE INSTRUCTIONS AND FOLLOWUP:  Appointment with Dr. Hall Busing in 2 days after discharge, Dr. Inez Pilgrim in 1 week after discharge. Have CBC checked in approximately 3 days after discharge and make decisions about discontinuation of neutropenia precautions.  CONSULTANTS:  Dr. Barbette Reichmann, care management, social work.   RADIOLOGIC DATA:  Chest x-ray, PA and lateral, 01/20/2013, showed stable cardiomegaly, no acute abnormality. CT scan of chest with IV contrast, 01/21/2013, showed no evidence of acute thoracic aortic pathology.  A central pulmonary emboli. Cardiac chambers are mildly enlarged. There is no significant pulmonary interstitial edema. There are emphysematous changes in both lungs and there is subsegmental atelectasis in the lower lobes, especially on the right. There is thyromegaly involving the right lobe.  Ultrasound of abdomen, limited survey, 01/26/2013, revealed no sonographic evidence of cholecystitis or cholelithiasis.  HISTORY OF PRESENT ILLNESS:  The patient is an 81 year old Caucasian female with past medical history significant for history of CHF who presented  to the hospital with complaints of shortness of breath as well as cough. Please refer to Dr. Graciela Husbands admission note on 01/21/2013. On admission to the hospital, the patient's temperature was 100, pulse was 82, respiration rate was 16, blood pressure 147/59 and saturation was 94% on oxygen therapy. She was in no significant distress,  looked comfortable, however frail.  Her lungs had bilateral diffuse rales as well as scattered expiratory wheezing. She had also mild pitting edema bilaterally, but otherwise no acute abnormalities.   LABORATORY DATA:  On day of admission, 01/20/2013 showed elevated glucose to 154, sodium 132 and potassium 3.3. Otherwise, BMP was unremarkable. The patient's total bilirubin was mildly elevated at 1.1. The patient's digoxin level was normal at 0.87. White blood cell count was very low at 2.0, hemoglobin was 15.4 and platelet count was 83. Blood cultures taken on 01/20/2013 showed no growth. Urinalysis was unremarkable, except for 100 mg/dL protein and 16 red blood cells as well as 1 white blood cell.    The patient's EKG showed A-fib at 80 beats per minute, left axis deviation, left bundle branch block. No acute ST-T changes were noted. Chest x-ray showed no significant changes, however, CT scan of chest revealed COPD.   HOSPITAL COURSE:  The patient was admitted to the hospital for further evaluation. She was started on steroids and nebulizers as well as oxygen and antibiotic therapy. With this her condition significantly improved.   In regards to COPD exacerbation, it was felt the patient's shortness of breath was COPD exacerbation related. The patient was treated for acute bronchitis. Sputum cultures were taken. However, the patient's sputum cultures are still pending. She is to continue steroid tapering as well as antibiotics for 3 days. She is significantly improved. She was counseled about tobacco abuse however refused any nicotine replacement therapy.  On the day of discharge, she felt satisfactory, did not recommend any significant discomfort and her oxygenation was stable on room air. Her temperature was 98.2, pulse was 59 to 75, respiratory rate was 20, blood pressure 468 to 032 systolic and 12Y to 48G diastolic and O2 sats were 94% to 95% on room air at rest. The patient was evaluated by a physical  therapist who recommended physical therapy in the skilled nursing facility and rehabilitation. She is going to be discharged today on 01/26/2013 for finishing of her antibiotic as well as steroid tapering for COPD exacerbation and treatment of her generalized weakness.   Next, the patient was noted to be hypokalemic as well as hyponatremic in the hospital. It was felt to be due to dehydration. She was given some IV fluids with potassium and her sodium level normalized already by 01/23/2013. The patient's Lasix was placed on hold for time being while she was in the hospital as she was felt to be dehydrated.  Now the patient's Lasix is being resumed, however, at lower doses.   Next, the patient was noted to be neutropenic as well as thrombocytopenic. She was consulted by Dr. Inez Pilgrim who felt that the patient, because of changes on manual examination, noting 8 bands, 28% segmented neutrophils, as well as 38% lymphocytes and 9 lymphocytes and monocytes, the patient needs bone marrow biopsy.  Bone marrow biopsy was performed on the 12th and was noted by Dr. Inez Pilgrim not to be significantly abnormal. Apparently, Dr. Inez Pilgrim discussed with pathologist and no blasts were seen and no evidence of acute leukemia. The patient had ultrasound of her abdomen done, on the day of discharge, 01/26/2013.  However, no significant abnormalities were found. The patient is to follow up with Dr. Inez Pilgrim for further recommendations in the cancer clinic in approximately 1 week after discharge. Meanwhile, with conservative management, the patient's white blood cell count normalized and her absolute neutrophil count also improved to 500 by the day of discharge. It is recommended to follow the patient's neutrophil count and discontinue her neutropenic isolation whenever it reaches 800. She is to continue antibiotic therapy for 2 more days.  It was felt that the patient's neutropenia as well as thrombocytopenia was related to her current  infection.   In regards to A-fib, the patient continued to be in A-fib.  Her A-fib was well controlled. She is to continue metoprolol and Cardizem as well as digoxin. The patient's heart rate is fluctuating between 50s to 70s in the hospital.   For CHF, the patient's CHF appeared to be compensated while in the hospital. The patient received some IV fluids and she is being restarted back on Lasix by the day of discharge, however lower doses of Lasix.  In regards to hypertension, the patient's blood pressure is ranging from 120s to 140s. However, it is recommended to follow and possibly advance the patient's ACE inhibitor if needed to control her blood pressure even better.  The patient's Lasix dose was decreased. We did not do any other investigation for congestive heart failure as the patient apparently had echocardiogram done in May 13. At that time, the patient had mildly reduced left ventricular function with estimated ejection fraction of 40% to 45% and LVH as well as moderate-to-severe TR, moderate MR were noted.  No other studies were performed during this admission due to no significant exacerbation of congestive heart failure. The patient is to continue current management and follow up with cardiologist as outpatient.   The patient is being discharged in stable condition with the above-mentioned medications and follow-up. She is to follow up with primary care physician as well as Dr. Inez Pilgrim in the next 3 to 7 days after discharge.   TIME SPENT: 40 minutes. ____________________________ Theodoro Grist, MD rv:sb D: 01/26/2013 13:52:42 ET T: 01/26/2013 15:13:24 ET JOB#: [id number]  cc: Theodoro Grist, MD, <Dictator> Odessia Asleson MD ELECTRONICALLY SIGNED 02/16/2013 12:28

## 2014-12-03 NOTE — Consult Note (Signed)
Chief Complaint:  Subjective/Chief Complaint NO ACUTE COMPLAINTS  , NOT SOB OR COUGHING   VITAL SIGNS/ANCILLARY NOTES: **Vital Signs.:   15-Jun-14 04:30  Vital Signs Type Routine  Temperature Temperature (F) 98.7  Celsius 37  Temperature Source oral  Pulse Pulse 75  Respirations Respirations 20  Systolic BP Systolic BP 132  Diastolic BP (mmHg) Diastolic BP (mmHg) 82  Mean BP 98  Pulse Ox % Pulse Ox % 93  Pulse Ox Activity Level  At rest  Oxygen Delivery Room Air/ 21 %    09:35  Vital Signs Type Pre Medication  Pulse Pulse 80  Pulse source if not from Vital Sign Device radial  Systolic BP Systolic BP 120  Diastolic BP (mmHg) Diastolic BP (mmHg) 70  Mean BP 86  BP Source  if not from Vital Sign Device manual   Brief Assessment:  GEN no acute distress   Respiratory normal resp effort  no use of accessory muscles   Additional Physical Exam ALERT AND COOPERATIVE, NEURO GROSSLY NON FOCAL NO THRUSH  NO BRUISING OR RASH   Lab Results: Routine Chem:  15-Jun-14 05:28   Result Comment DIFF - CANCELED DUPLICATE PER DR.V.  - PLEASE REFER TO ACC# [id number] FOR  - MANUAL DIFF. MPG 01/25/13  Result(s) reported on 25 Jan 2013 at 09:56AM.  Result Comment WBC DIFF - PATHOLOGIST TO REVIEW SMEAR. COMMENTS  - APPEAR ON REPORT WHEN COMPLETE.  Result(s) reported on 25 Jan 2013 at 09:21AM.  Hemoglobin A1c [facility])  6.5 (The American Diabetes Association recommends that a primary goal of therapy should be <7% and that physicians should reevaluate the treatment regimen in patients with HbA1c values consistently >8%.)  Routine Hem:  15-Jun-14 05:28   Neutrophil % -  Lymphocyte % -  Monocyte % -  Eosinophil % -  Basophil % -  Neutrophil # -  Lymphocyte # -  Monocyte # -  Eosinophil # -  Basophil # -  Reference Accession# NA  Bands -  Segmented Neutrophils -  Segmented Neutrophils 7  Lymphocytes -  Lymphocytes 36  Variant Lymphocytes -  Variant Lymphocytes 33  Monocytes -   Monocytes 23  Eosinophil -  Eosinophil 1  Basophil -  Metamyelocyte -  Myelocyte -  Promyelocyte -  Blast-Like -  Other Cells -  NRBC -  Diff Comment 1 -  Diff Comment 1 ANISOCYTOSIS  Diff Comment 2 -  Diff Comment 2 NORMAL PLT MORPHOLGY  Result(s) reported on 25 Jan 2013 at 09:21AM.  Diff Comment 3 -  Diff Comment 4 -  Diff Comment 5 -  Diff Comment 6 -  Diff Comment 7 -  Diff Comment 8 -  Diff Comment 9 -  Diff Comment 10 - (Result(s) reported on 25 Jan 2013 at 09:56AM.)  WBC (CBC) 4.6  RBC (CBC) 5.18  Hemoglobin (CBC)  16.1  Hematocrit (CBC)  48.2  Platelet Count (CBC)  117  MCV 93  MCH 31.2  MCHC 33.5  RDW  14.6   Assessment/Plan:  Assessment/Plan:  Assessment SEE ALSO INITIAL CONSULT.  SIGNIFICANT NEUTROPENIA AND THROMBOCYTOPENIA, WITH DIFFERENTIAL INCLUDING INFECTIOUS OR VIRAL MARROW SUPRESSION, NO HX TO SUGGEST DRUG EFFECT, MDS, CONCERN OF ALEUKEMIC LEUKEMIA, SO THAT BM EXAM RECOMMENDED. FEBRILE REPORTED PRIOR TO ADMIT, HAS BEEN AFEBRILE SINCE ADMISSION... BM DONE 6/12  STABLE,  NO RESP SYMPTOMS.  C. B12 NORMAL, HIV NEG, HEP C PENDING . WBC NO CHANGE. PLTS SLOWLY BETTER STILL LOW    PLAN...... CONTINUE LEVAQUIN, F/U CULTURES,  WATCH TEMP CURVE, IF SPIKE FEVER THEN RECULTURE AND ADD CEFAPIME AND NEUPOGEN, BUT NO INDICATION FOR ANY CHANGES CURRENTLY. AS PRIOR NOTED,PRELIMINARY REPORT, DICUSSED WITH PATHOLOGY, NO BLASTS SEEN, NO EVIDENCE OF ACUTE LEUKEMIA, CONSIDER IMAGING/U/S FOR SPLEEN SIZE, CHECK DAILY CBC. IF D/C IN NEXT DAY OR 2 OK TO [facility] F/U IN 1 WEEK   Electronic Signatures: Marin Roberts (MD)  (Signed 15-Jun-14 13:39)  Authored: Chief Complaint, VITAL SIGNS/ANCILLARY NOTES, Brief Assessment, Lab Results, Assessment/Plan   Last Updated: 15-Jun-14 13:39 by Marin Roberts (MD)

## 2014-12-03 NOTE — Consult Note (Signed)
Chief Complaint:  Subjective/Chief Complaint NO ACUTE COMPLAINTS  SEEN POST BM PROCEEDURE, NO BACK PAIN, NOT SOB OR COUGHING   VITAL SIGNS/ANCILLARY NOTES: **Vital Signs.:   12-Jun-14 14:02  Vital Signs Type Routine  Temperature Temperature (F) 97.9  Celsius 36.6  Pulse Pulse 62  Respirations Respirations 20  Systolic BP Systolic BP 544  Diastolic BP (mmHg) Diastolic BP (mmHg) 62  Mean BP 78  Pulse Ox % Pulse Ox % 93  Pulse Ox Activity Level  At rest  Oxygen Delivery Room Air/ 21 %   Brief Assessment:  GEN no acute distress   Respiratory normal resp effort   Gastrointestinal details normal Soft  Nontender   Additional Physical Exam ALERT AND COOPERATIVE, NEURO GROSSLY NON FOCAL  SIGNIFICANT STRONGER VS PRIOR DAY   Lab Results: Thyroid:  12-Jun-14 04:12   Thyroid Stimulating Hormone  0.350 (0.45-4.50 (International Unit)  ----------------------- Pregnant patients have  different reference  ranges for TSH:  - - - - - - - - - -  Pregnant, first trimetser:  0.36 - 2.50 uIU/mL)  General Ref:  12-Jun-14 04:12   HIV Antibodies ========== TEST NAME ==========  ========= RESULTS =========  = REFERENCE RANGE =  HIV 1/2 AB W/CONF WB  Panel [id number] HIV 1/O/2 Abs-Index Value       [   <1.00                ]             <1.00 Index Value: Specimen reactivity relative to the negative cutoff. HIV 1/O/2 Abs, Qual             [   Non Reactive         ]      Non Reactive               [facility]            No: [id number]           [address], [place], [place] [address]           Lindon Romp, MD [phone]   Result(s) reported on 23 Jan 2013 at 08:17AM.  Routine Chem:  12-Jun-14 04:12   Magnesium, Serum  1.6 (1.8-2.4 THERAPEUTIC RANGE: 4-7 mg/dL TOXIC: > 10 mg/dL  -----------------------)  Result Comment MANUAL DIFFERENTIAL - DUE TO LOW WHITE BLOOD CELL COUNT,  - 50 CELL DIFFERENTIAL PERFORMED..TPL  Result(s) reported on 22 Jan 2013 at 05:29AM.   Result Comment WBC - RESULTS VERIFIED BY REPEAT TESTING.  - CRITICAL VALUE PREVIOUSLY NOTIFIED.  - TPL  Result(s) reported on 22 Jan 2013 at 08:31AM.  Glucose, Serum  192  BUN 12  Creatinine (comp) 0.66  Sodium, Serum  135  Potassium, Serum 3.5  Chloride, Serum 100  CO2, Serum 28  Calcium (Total), Serum 9.6  Anion Gap 7  Osmolality (calc) 275  eGFR (African American) >60  eGFR (Non-African American) >60 (eGFR values <59m/min/1.73 m2 may be an indication of chronic kidney disease (CKD). Calculated eGFR is useful in patients with stable renal function. The eGFR calculation will not be reliable in acutely ill patients when serum creatinine is changing rapidly. It is not useful in  patients on dialysis. The eGFR calculation may not be applicable to patients at the low and high extremes of body sizes, pregnant women, and vegetarians.)  Routine Hem:  12-Jun-14 04:12   WBC (CBC)  1.1  RBC (CBC) 5.05  Hemoglobin (CBC) 16.0  Hematocrit (  CBC) 47.0  Platelet Count (CBC)  68  MCV 93  MCH 31.7  MCHC 34.0  RDW 14.5  Bands 8  Segmented Neutrophils 28  Lymphocytes 38  Variant Lymphocytes 16  Monocytes 10  Manual Diff MANUAL DIFF DONE  Diff Comment 1 RBCs APPEAR NORMAL  Diff Comment 2 LARGE PLATELETS  Diff Comment 3 PLTS VARIED IN SIZE  Result(s) reported on 22 Jan 2013 at 08:31AM.   Assessment/Plan:  Assessment/Plan:  Assessment SEE ALSO INITIAL CONSULT.  SIGNIFICANT NEUTROPENIA AND THROMBOCYTOPENIA, WITH DIFFERENTIAL INCLUDING INFECTIOUS OR VIRAL MARROW SUPRESSION, NO HX TO SUGGEST DRUG EFFECT, MDS, CONCERN OF ALEUKEMIC LEUKEMIA, SO THAT BM EXAM RECOMMENDED. FEBRILE REPORTED PRIOR TO ADMIT, HAS BEEN AFEBRILE. WBC SLIGHT IMPROVEMENT VS 6/11... BM DONE EARLIER IN THE DAY, WELL TOLERATED. HAS REMAINED AFEBRILE.  STABLE, SIGNIFICANTLY STRONGER, NO RESP SYMPTOMS.   PLAN...... CONTINUE LEVAQUIN, F/U CULTURES, WATCH TEMP CURVE, IF SPIKE FEVER THEN RECULTURE AND ADD CEFAPINE AND NEUPOGEN,  BUT NO INDICATION FOR ANY CHANGES CURRENTLY........Marland KitchenPRELIMINARY REPORT, DICUSSED WITH PATHOLOGY, NO BLASTS SEEN, NO EVIDENCE OF ACUTE LEUKEMIA, I DISCUSSED WITH PATIENT......  NOTE, PATIENT WAS SEEN AND THIS EVALUATION IS FOR PATIENTS STATUS AFTERNOON OF 6/12,  AND THE NOTE IS DATED AND TIMED TO REFLECT THAT EVALUATION AND FOR CONTINUITY,,  BUT THESE NOTATIONS ARE ACTUALLY NOT ENTERED UNTIL AM OF 6/13 DUE TO MY INABILITY TO ACCESS COMPUTER   Electronic Signatures: Dallas Schimke (MD)  (Signed 13-Jun-14 09:03)  Authored: Chief Complaint, VITAL SIGNS/ANCILLARY NOTES, Brief Assessment, Lab Results, Assessment/Plan   Last Updated: 13-Jun-14 09:03 by Dallas Schimke (MD)

## 2014-12-05 NOTE — Consult Note (Signed)
General Aspect 81 year old Caucasian female with  systolic and diastolic congestive heart failure with ejection fraction of 40 to 45%, pulmonary hypertension , severe COPD , long smoking history , discharged on May 7th to rehab and has been spending the last two weeks at home, presents with progressive shortness of breath in the last couple of days. Cardiology was consult in for shortness of breath.  She reports that she drinks a significant amount of coffee daily. She has several cups, and drinks other drinks the rest of the day. She does not watch her fluid intake. She has had significant lower extremity edema over the past week or so. She has cough, PND, orthopnea. She does not have a nebulizer machine at home. She does not use home oxygen.  She presented after shortness of breath became worse. In the emergency room she had elevated BNPmildly at 2929. Chronic atrial fibrillation with RVR with rates of 140s. She was given diltiazem 5 mg IV x2 as well as a dose of Lasix.    Present Illness . PAST MEDICAL/SURGICAL HISTORY:  1. Chronic atrial fibrillation, not on Coumadin or any other anticoagulation due to a history of gastrointestinal bleeding and patient preference.  2. Peripheral neuropathy.  3. Left hip fracture, status post surgery in August 2010. 4. Exploratory laparotomy following hysterectomy in 5809. 5. Systolic congestive heart failure with ejection fraction of 40 to 45%, with moderate-to-severe   tricuspid regurgitation, moderate mitral regurgitation. 6. Chronic anemia. 7. Hiatal hernia.  8. Chronic tobacco abuse. 9. History of uterine cancer, status post hysterectomy. 10. History of skin cancer. 11. Psoriasis.   SOCIAL HISTORY: She still smokes one cigarette a day. She drinks one glass of wine nightly. No other drug use.   FAMILY HISTORY: Father with coronary artery disease and myocardial infarction.   Physical Exam:   GEN well developed, well nourished, no acute distress,  thin    HEENT red conjunctivae    NECK supple  No masses    RESP normal resp effort  rhonchi  crackles    CARD Regular rate and rhythm  Murmur    Murmur Systolic    ABD denies tenderness  soft    LYMPH negative neck    EXTR positive edema    SKIN normal to palpation    NEURO motor/sensory function intact    PSYCH alert, A+O to time, place, person, good insight   Review of Systems:   Subjective/Chief Complaint Shortness of breath, edema    General: Fatigue  Weakness    Skin: No Complaints    ENT: No Complaints    Eyes: No Complaints    Neck: No Complaints    Respiratory: Frequent cough  Short of breath    Cardiovascular: Dyspnea    Gastrointestinal: No Complaints    Genitourinary: No Complaints    Vascular: No Complaints    Musculoskeletal: No Complaints    Neurologic: No Complaints    Hematologic: No Complaints    Endocrine: No Complaints    Psychiatric: No Complaints    Review of Systems: All other systems were reviewed and found to be negative    Medications/Allergies Reviewed Medications/Allergies reviewed        Admit Diagnosis:   CHF: 18-Jan-2012, Active, CHF  Home Medications: Medication Instructions Status  metoprolol tartrate 50 mg oral tablet 1 tab(s) orally 2 times a day Active  Lasix 20 mg oral tablet 1 tab(s) orally 2 times a day Active  Mag-Ox 400 oral tablet 1 tab(s) orally  2 times a day Active  lisinopril 5 mg oral tablet 1 tab(s) orally once a day Active  Combivent Respimat CFC free 100 mcg-20 mcg/inh inhalation aerosol 2 puff(s) inhaled every 4 hours, As Needed Active  Evista 60 mg oral tablet 1 tab(s) orally once a day Active  Diltiazem Hydrochloride CD 120 mg/24 hours oral capsule, extended release 1 cap(s) orally once a day Active   Lab Results:  Routine Chem:  07-Jun-13 05:49    Glucose, Serum  128   BUN 14   Creatinine (comp) 0.66   Sodium, Serum 138   Potassium, Serum 3.5   Chloride, Serum 101   CO2, Serum 27    Calcium (Total), Serum 8.8   Anion Gap 10   Osmolality (calc) 278   eGFR (African American) >60   eGFR (Non-African American) >60 (eGFR values <21m/min/1.73 m2 may be an indication of chronic kidney disease (CKD). Calculated eGFR is useful in patients with stable renal function. The eGFR calculation will not be reliable in acutely ill patients when serum creatinine is changing rapidly. It is not useful in  patients on dialysis. The eGFR calculation may not be applicable to patients at the low and high extremes of body sizes, pregnant women, and vegetarians.)   Magnesium, Serum  1.7 (1.8-2.4 THERAPEUTIC RANGE: 4-7 mg/dL TOXIC: > 10 mg/dL  -----------------------)  Routine Hem:  07-Jun-13 05:49    WBC (CBC) 8.6   RBC (CBC) 4.86   Hemoglobin (CBC) 15.3   Hematocrit (CBC) 46.9   Platelet Count (CBC) 182   MCV 97   MCH 31.6   MCHC 32.7   RDW 14.4   Neutrophil % 76.4   Lymphocyte % 12.6   Monocyte % 10.0   Eosinophil % 0.7   Basophil % 0.3   Neutrophil #  6.6   Lymphocyte # 1.1   Monocyte # 0.9   Eosinophil # 0.1   Basophil # 0.0 (Result(s) reported on 18 Jan 2012 at 0[facility])   EKG:   Interpretation EKG shows atrial fibrillation with ventricular rate 140 beats per minute, left bundle branch block    Sulfa drugs: Rash  Penicillin: Rash  Vital Signs/Nurse's Notes: **Vital Signs.:   07-Jun-13 11:50   Vital Signs Type Routine   Temperature Temperature (F) 97.6   Celsius 36.4   Temperature Source oral   Pulse Pulse 123   Pulse source per vital sign device   Respirations Respirations 20   Systolic BP Systolic BP 97   Diastolic BP (mmHg) Diastolic BP (mmHg) 66   Mean BP 76   BP Source vital sign device   Pulse Ox % Pulse Ox % 93   Pulse Ox Activity Level  At rest   Oxygen Delivery 2L     Impression 81year old Caucasian female with  systolic and diastolic congestive heart failure with ejection fraction of 40 to 45%,  severe COPD , long smoking history ,  discharged on May 7th to rehab and has been spending the last two weeks at home, presents with progressive shortness of breath in the last couple of days.  1) acute on chronic systolic and diastolic CHF Stronger diastolic CHF component given the MR and TR, severe COPD, atrial fibrillation with poor rate control. I suspect she has been drinking significant amounts of fluid that has contributed to fluid overload Elevated heart rate on admission likely from respiratory distress --Would increase Lasix to 40 mg twice a day iv --Would add low-dose digoxin 0.125 mg daily for rate control  in addition to her current medications --Will monitor rate and increase other medications as blood pressure allows Notes indicate she is not a warfarin candidate --She will likely need Lasix 40 mg twice a day at home with decreased fluid intake, close monitoring of her weight, and titration of diuretic as needed for weight gain. We have talked to her about decreasing her coffee intake.  2) COPD I suspect this is contributing to her shortness of breath as well She may benefit from nebulizer treatments at home as well as in the hospital She currently does not have nebulizer apparatus at home She may benefit also from home oxygen If no significant improvement in her shortness of breath, would treat aggressively with steroids  3) smoking Encourage smoking cessation  4) atrial fibrillation Would continue diltiazem, beta blocker Will start low-dose digoxin Will hold lisinopril given low blood pressure   Electronic Signatures: Ida Rogue (MD)  (Signed 07-Jun-13 18:09)  Authored: General Aspect/Present Illness, History and Physical Exam, Review of System, Past Medical History, Health Issues, Home Medications, Labs, EKG , Allergies, Vital Signs/Nurse's Notes, Impression/Plan   Last Updated: 07-Jun-13 18:09 by Ida Rogue (MD)

## 2014-12-05 NOTE — Discharge Summary (Signed)
PATIENT NAME:  Kristina Navarro, [name] MR#:  [id number] DATE OF BIRTH:  03/21/1926  DATE OF ADMISSION:  01/17/2012 DATE OF DISCHARGE:  01/22/2012  ADMISSION DIAGNOSIS: Acute on chronic systolic and diastolic heart failure.   DISCHARGE DIAGNOSES: 1. Acute on chronic systolic and diastolic heart failure with ejection fraction of 40% to 45%, moderate to severe tricuspid regurgitation.  2. History of atrial fibrillation.  3. Chronic obstructive pulmonary disease.  4. Urinary tract infection.  5. Low magnesium and potassium.  6. Constipation.  7. Nausea.  8. Hyponatremia.   CONSULT: Dr. Mariah MillingGollan.  LABORATORY, DIAGNOSTIC AND RADIOLOGICAL DATA: Laboratory at discharge: Sodium 133, potassium 4.5, chloride 95, bicarbonate 28, BUN 23, creatinine 0.82, glucose 144. There was no urine culture taken.   HOSPITAL COURSE: 81 year old female with a history of chronic atrial fibrillation not on anticoagulation due to history of GI bleed who presented with acute respiratory failure, found to have acute on chronic systolic and diastolic heart failure. For further details please refer to the history and physical.  1. Acute systolic and diastolic heart failure. Patient was placed on telemetry. Her was in chronic atrial fibrillation. Cardiology was consulted. She was placed on IV Lasix with strict ins and outs. She actually diuresed quite well. She is euvolemic at discharge. She will be changed to p.o. Lasix along with some potassium and magnesium supplements. Appreciate cardiology consult.  2. History of atrial fibrillation, still in atrial fibrillation which is chronic. She is on anticoagulation due to history of GI bleed. She will continue with aspirin. Heart rates are better controlled currently on diltiazem, metoprolol and digoxin was added.  3. History of chronic obstructive pulmonary disease. Patient had some shortness of breath so we did place her on steroids, which she will be discharged with a quick steroid  taper.  4. Urinary tract infection. Patient is on ciprofloxacin. There was no urine culture taken.  5. Low magnesium and potassium which was repleted.  6. Constipation, resolved.  7. Hyponatremia, likely from Lasix.   DISCHARGE MEDICATIONS:  1. Metoprolol 50 b.i.d.  2. Combivent 2 puffs q.4 hours p.r.n.   3. Evista 60 mg daily.  4. Diltiazem 240 mg daily. 5. Lasix 40 mg b.i.d.  6. Magnesium oxide 400 mg daily.  7. Prednisone taper starting at 50 mg, taper x 10 mg daily.  8. Digoxin 0.125 daily.  9. Ciprofloxacin 500 mg b.i.d. for two days.  10. Cholecalciferol 400 mg daily.  11. KCl 20 mEq daily.  12. Aspirin 325 daily   DISCHARGE DIET: Low sodium.   DISCHARGE ACTIVITY: As tolerated.   DISCHARGE FOLLOW UP: Follow up 1 to 2 weeks with Dr. Mariah MillingGollan and Dr. Arlana Pouchate.   TIME SPENT: 35 minutes.   ____________________________ Janyth ContesSital P. Juliene PinaMody, MD spm:cms D: 01/22/2012 09:22:32 ET T: 01/22/2012 10:01:28 ET JOB#: [id number]  cc: Alysa Duca P. Juliene PinaMody, MD, <Dictator> Antonieta Ibaimothy J. Gollan, MD Jillene Bucksenny C. Arlana Pouchate, MD Janyth ContesSITAL P Coren Crownover MD ELECTRONICALLY SIGNED 01/23/2012 12:27

## 2014-12-05 NOTE — Consult Note (Signed)
Brief Consult Note: Diagnosis: CHF, AF.   Patient was seen by consultant.   Consult note dictated.   Comments: REC  Agree with current therapy, CHADS 2 score of 2 with h/o GI bleed and easy bruising. Comt diltiazem drip for now, review echo.  Electronic Signatures: Marcina MillardParaschos, Eathon Valade (MD)  (Signed 03-May-13 19:38)  Authored: Brief Consult Note   Last Updated: 03-May-13 19:38 by Marcina MillardParaschos, Dellamae Rosamilia (MD)

## 2014-12-05 NOTE — Consult Note (Signed)
PATIENT NAME:  Kristina Navarro, ROGUE MR#:  [id number] DATE OF BIRTH:  1926/06/11  DATE OF CONSULTATION:  12/14/2011  REFERRING PHYSICIAN:  Enedina Finner, MD CONSULTING PHYSICIAN:  Marcina Millard, MD  CHIEF COMPLAINT: Shortness of breath.   HISTORY OF PRESENT ILLNESS: The patient is an 81 year old female referred for evaluation of congestive heart failure and atrial fibrillation with a rapid ventricular response. The patient reports that she was in her usual state of health until earlier this week when she had a skin cancer removed and was experiencing worsening shortness of breath. She presented to [facility] emergency room earlier today with shortness of breath and was noted to be tachycardic with atrial fibrillation and rapid ventricular response. The patient was treated with intravenous diltiazem with improvement in her heart rate and shortness of breath. She was admitted to the critical care unit where admission labs were notable for a troponin of less than 0.02. The patient denies chest pain.   PAST MEDICAL HISTORY:  1. History of atrial fibrillation, on aspirin. The patient has been reluctant to be on anticoagulation due to easy bruising and a history of GI bleed secondary to Advil.  2. Moderately reduced left ventricular function with reported ejection fraction of 45% by echocardiogram 2011.   3. Peripheral neuropathy.  4. History of tobacco abuse.   MEDICATIONS:  1. Aspirin 81 mg every other day.   2. Diltiazem CD 120 mg daily.  3. Metoprolol 50 mg b.i.d.  4. Bone support formula capsule 1 daily.  5. CoQ10 at 100 mg daily.  6. Evista 60 mg daily.  7. Mineral capsule b.i.d.  8. Multi-Vites 1 daily.  9. Zygest capsule 1 daily.   SOCIAL HISTORY: The patient is a widow. She currently lives alone. She intermittently smokes and drinks alcohol.   FAMILY HISTORY: No immediate family history of coronary artery disease or myocardial infarction.   REVIEW OF SYSTEMS: CONSTITUTIONAL: No fever or  chills. EYES: No blurry vision.  EARS: No hearing loss. RESPIRATORY: Shortness of breath as described above.  CARDIOVASCULAR: No chest pain. GASTROINTESTINAL: No nausea, vomiting, diarrhea.  GU: No dysuria or hematuria. ENDOCRINE: No polyuria or polydipsia. HEMATOLOGIC: The patient does report history of GI bleed as well as easy bruising. MUSCULOSKELETAL: No arthralgias or myalgias. NEUROLOGICAL: No focal muscle weakness or numbness. PSYCHOLOGICAL: No anxiety or depression.   PHYSICAL EXAMINATION:  VITAL SIGNS: Blood pressure 110/58, pulse 110 and regular, regular respirations 25, pulse oximetry 95%.   HEENT: Pupils equal and reactive to light and accommodation.   NECK: Supple without thyromegaly.   LUNGS: Expiratory wheezes.   CARDIOVASCULAR: Normal jugular venous pressure. Normal point of maximal impulse. Irregular, irregular rhythm. Normal S1, S2. No appreciable gallop, murmur, or rub.   ABDOMEN: Soft and nontender. Pulses were intact bilaterally.   MUSCULOSKELETAL: Normal muscle tone.   NEUROLOGIC: The patient is alert and oriented x3. Motor and sensory both grossly intact.   IMPRESSION: An 81 year old female with history of atrial fibrillation with a CHADS2 score of 2 secondary to advanced age and now a new diagnosis of congestive heart failure. The patient has previously been on aspirin due to preference in light of history of prior GI bleed and easy bruising.   RECOMMENDATIONS:  1. Agree with overall current therapy.  2. We will continue Cardizem drip for now.  3. Review 2D echocardiogram.  4. Further recommendations regarding chronic anticoagulation pending the patient's initial clinical course and echocardiogram results.     ____________________________ Marcina Millard, MD ap:vtd D: 12/14/2011  19:37:07 ET T: 12/15/2011 08:50:01 ET JOB#: [id number]  cc: Marcina MillardAlexander Myrene Bougher, MD, <Dictator> Marcina MillardALEXANDER Dontavian Marchi MD ELECTRONICALLY SIGNED 12/16/2011 9:39

## 2014-12-05 NOTE — H&P (Signed)
PATIENT NAME:  Kristina Navarro, Kristina Navarro MR#:  [id number] DATE OF BIRTH:  06/27/1926  DATE OF ADMISSION:  01/17/2012  REFERRING PHYSICIAN: Dorothea GlassmanPaul Malinda, MD    PRIMARY CARE PHYSICIAN: Dewaine Oatsenny Tate, MD   CHIEF COMPLAINT: Shortness of breath.   HISTORY OF PRESENT ILLNESS: The patient is an 81 year old Caucasian female with recent new onset diagnosis of systolic congestive heart failure with ejection fraction of 40 to 45%, with some vascular abnormalities, and also a new diagnosis of chronic obstructive pulmonary disease, who was discharged on May 7th to rehab and has been spending the last two weeks at home, presents with progressive shortness of breath in the last couple of days. The patient has no chest pain. The patient has had increased weakness and lower extremity edema. On arrival, today, she was noted to have elevated BNP mildly at 2929. Furthermore, she was noted to have atrial fibrillation with RVR with rates of 120s. She was given diltiazem 5 mg IV x2 as well as a dose of Lasix, and the Hospitalist Service was contacted for further evaluation and management. Currently the heart rates are in the 80s to low 100s. The patient, again, has no chest pain.   PAST MEDICAL/SURGICAL HISTORY:  1. Chronic atrial fibrillation, not on Coumadin or any other anticoagulation due to a history of gastrointestinal bleeding and patient preference.  2. Peripheral neuropathy.  3. Left hip fracture, status post surgery in August 2010. 4. Exploratory laparotomy following hysterectomy in 1987. 5. Systolic congestive heart failure with ejection fraction of 40 to 45%, with moderate-to-severe   tricuspid regurgitation, moderate mitral regurgitation. 6. Chronic anemia. 7. Hiatal hernia.  8. Chronic tobacco abuse. 9. History of uterine cancer, status post hysterectomy. 10. History of skin cancer. 11. Psoriasis.  ALLERGIES: Penicillin and sulfa.  MEDICATIONS:  1. Tylenol 650 mg every 4 hours p.r.n.  2. Metoprolol 50 mg  p.o. b.i.d.  3. Evista 60 mg daily.  4. Vitamin D3 400 international units b.i.d.  5. Combivent inhaler 2 puffs every 4 hours as needed.  6. Diltiazem CD 240 mg p.o. daily.  7. Lisinopril 5 mg daily.  8. Multivitamin 1 tab daily.  9. Aspirin 81 mg daily.  10. Nicotine patch 21 mg transdermal daily.  11. Lasix 40 mg b.i.d.  12. Mag-Ox 400 mg b.i.d.   SOCIAL HISTORY: She still smokes one cigarette a day. She drinks one glass of wine nightly. No other drug use.   FAMILY HISTORY: Father with coronary artery disease and myocardial infarction.   REVIEW OF SYSTEMS: CONSTITUTIONAL: Positive for weakness. EYES: No blurry vision or double vision. ENT: No tinnitus. RESPIRATORY: Has a dry cough, shortness of breath, and dyspnea on exertion. CARDIOVASCULAR: No chest pain. Dyspnea on exertion and increased lower extremity edema. GASTROINTESTINAL: No nausea or vomiting. Episode of diarrhea yesterday. No melena or bright red blood per rectum. GENITOURINARY: Denies dysuria or hematuria. SKIN: Denies any new rashes. The patient has a history of skin cancer and psoriasis. ENDOCRINE: Denies polyuria or nocturia. MUSCULOSKELETAL: Positive for arthritis. NEUROLOGIC: No history of cerebrovascular accident or transient ischemic attack. PSYCHIATRIC: No anxiety or depression.   PHYSICAL EXAMINATION:  VITAL SIGNS: On arrival, temperature was 97.2, pulse was 122, respiratory rate 18, blood pressure 131/90, oxygen saturation 96% on room air.   GENERAL: The patient is an elderly Caucasian female lying in bed in no obvious distress, talking in full sentences.   HEENT: Normocephalic, atraumatic. Pupils are equal and reactive. Anicteric sclerae. Moist mucous membranes.   NECK: Supple. No  JVD, no hepatojugular reflex. No lymphadenopathy.   CARDIOVASCULAR: S1, S2 irregularly irregular. No significant murmurs appreciated.   LUNGS: Left-sided more than the right-sided crackles.   ABDOMEN: Soft, nontender, nondistended.  Positive bowel sounds in all quadrants.   EXTREMITIES: 2+ edema to upper part of the shins, some ulcerations without any drainage on the left lower extremity. There is some erythema without warmth bilaterally, also some chronic venostasis changes.   NEUROLOGIC: Cranial nerves II through XII are grossly intact. Strength is 5 out of 5 in all extremities.   PSYCHIATRIC: Awake, alert, oriented x3.   LABORATORY, DIAGNOSTIC AND RADIOLOGICAL DATA:  BNP elevated at 2929, creatinine 0.84, sodium 134, potassium 3.8, glucose 114.  LFTs: Total protein 6.1, otherwise within normal limits.  Troponins negative x1.  TSH was 2.46.  WBC 8.1, hemoglobin 14.6, hematocrit 44.1.  Chest x-ray, PA and lateral: Small left pleural effusion, mild diffuse bilateral interstitial thickening which may reflect mild pulmonary edema versus interstitial pneumonitis.  EKG: Atrial fibrillation with RVR, rate 141. Left axis deviation, left bundle branch block.   ASSESSMENT AND PLAN: We have an 81 year old Caucasian female with a recent admission in the last month for acute systolic congestive heart failure and chronic obstructive pulmonary disease with RVR, who presents with progressive shortness of breath for several days and evidence for acute systolic congestive heart failure again. The patient also has had atrial flutter/RVR which has responded to IV diltiazem. At this point, I would admit the patient to telemetry and start the patient on Lasix IV b.i.d. 20 mg dose, measure her ins and outs strictly, and start the patient on nitro paste and p.r.n. morphine as well as her beta blocker and lisinopril. The patient has felt no chest pains, and there is a negative troponin. The patient states that she has multiple fluid drinks a day and sips on fluids throughout the day which likely contributed to her bout of congestive heart failure. I doubt the patient is having a pneumonia as the patient has no fever, significant leukocytosis, or  evidence of pneumonia on x-ray. Furthermore, the patient was diagnosed with chronic obstructive pulmonary disease recently. The patient would likely need formal pulmonary function testing as an outpatient, but here I would start her on Xopenex p.r.n. as well as standing Spiriva. In regards to the congestive heart failure, I would also consult with Cardiology. The patient was seen by [facility] prior, but she wants to see . I would not recheck an echo as it was recently done.   For the RVR,  I would resume the beta blocker and Cardizem. The patient has had a significant improvement with Cardizem IV. The patient is not on anticoagulation as an outpatient given history of gastrointestinal bleeding and the patient's preference.   For tobacco abuse, she was counseled for three minutes. I would continue a nicotine patch here. I will start the patient on heparin for deep vein thrombosis prophylaxis and resume her outpatient medications.   CODE STATUS:  The patient is a FULL CODE.     TIME SPENT:   Total time spent is 60 minutes. ____________________________ Krystal Eaton, MD sa:cbb D: 01/17/2012 17:53:14 ET T: 01/17/2012 18:10:39 ET JOB#: [id number]  cc: Krystal Eaton, MD, <Dictator> Jillene Bucks. Arlana Pouch, MD Krystal Eaton MD ELECTRONICALLY SIGNED 02/01/2012 20:05

## 2014-12-05 NOTE — H&P (Signed)
PATIENT NAME:  Farrel GobbleEARSON, Torry H MR#:  [id number] DATE OF BIRTH:  July 25, 1926  DATE OF ADMISSION:  12/14/2011  PRIMARY CARE PHYSICIAN: Dewaine Oatsenny Tate, MD  CHIEF COMPLAINT: Increasing shortness of breath and leg swelling.  HISTORY OF PRESENT ILLNESS: Ms. Sharol Givenearson is an 81 year old Caucasian female with multiple medical problems who comes to the emergency room after she was found to be very tachycardic, per her primary care physician, with progressive increased shortness of breath for the last several days and leg swelling. In the ER, the patient was found to be in rapid atrial fibrillation, acute on chronic, with heart rate in the 120 to 140s. She was also found simultaneously to be in congestive heart failure which is a new onset for her. The patient is being admitted for rapid atrial fibrillation and congestive heart failure, new onset, acute, suspect systolic, for further evaluation and management.   PAST MEDICAL HISTORY:  1. Chronic atrial fibrillation, not on Coumadin or any anticoagulant secondary to the patient's preference and history of GI bleed in the past, presumed to be small intestinal bleed in 2011.  2. Peripheral neuropathy.  3. Left hip fracture status post surgery in August 2010.  4. Exploratory laparotomy following hysterectomy in 1987.  5. Echocardiogram in 2011 showed ejection fraction of 45% and showed biatrial enlargement with regional wall motion abnormalities along with moderate MR and moderate TR and biatrial enlargement.  6. EGD in 2011 as part of GI work-up which showed hiatus hernia only.  7. Colonoscopy done in August 2011, as part of anemia work-up and GI bleed work-up, showed internal hemorrhoids. Colon was otherwise normal. Hence source of bleeding was likely presumed to be small intestinal. The patient at that time required 2 units of blood transfusion.  8. Chronic tobacco abuse.  9. History of uterine cancer status post hysterectomy.   ALLERGIES: Penicillin and sulfa.    MEDICATIONS:  1. Bone support formula capsule once a day.  2. Calcipotriene 0.005% topical cream twice a day.  3. Co-Q10 100 mg p.o. daily.  4. Diltiazem CD 120 mg daily.  5. Evista 60 mg daily.  6. Metoprolol 50 mg twice a day. 7. Mineral capsule twice a day.  8. Vitamin D3 1 capsule twice a day, appears to be 400 international units.  9. Multivitamin p.o. daily.  10. Zygest capsule p.o. daily.  SOCIAL HISTORY: Lives at home by herself. She continues to smoke every day and drinks alcohol intermittently. She denies any alcohol related problems.   REVIEW OF SYSTEMS: CONSTITUTIONAL: Positive for fatigue and weakness. No fever. EYES: No blurred or double vision. ENT: No tinnitus, ear pain, or hearing loss. RESPIRATORY: No cough. Positive for some shortness of breath and dyspnea. CARDIOVASCULAR: No chest pain. Positive for dyspnea on exertion and leg edema. GASTROINTESTINAL: No nausea, vomiting, diarrhea, or abdominal pain. GENITOURINARY: No dysuria or hematuria. ENDOCRINE: No polyuria or nocturia. HEMATOLOGIC: Positive for chronic anemia. SKIN: Positive for skin lesions on the lower extremities consistent with skin cancers. MUSCULOSKELETAL: Positive for arthritis. NEUROLOGIC: No cerebrovascular accident or transient ischemic attack. PSYCH: No anxiety or depression. All other systems reviewed and negative.   PHYSICAL EXAMINATION:   GENERAL: The patient is awake, alert, and oriented x3, and in mild distress due to shortness of breath.   VITAL SIGNS: Afebrile, pulse 118 to 140s, atrial fibrillation, blood pressure 150/80, and saturations 96% on 2 liters.   HEENT: Atraumatic, normocephalic. Pupils are equal, round, and reactive to light and accommodation. Extraocular movements intact. Oral mucosa is  moist.   NECK: Supple. No JVD. No carotid bruit.   RESPIRATORY: Decreased breath sounds in the bases. There are bibasilar crackles heard. No rhonchi or respiratory distress.   ABDOMEN: Soft,  benign, and nontender. No organomegaly. Positive bowel sounds.   NEUROLOGIC: Grossly intact cranial nerves II through XII. No motor or sensory deficit.   EXTREMITIES: The patient has pedal edema in both lower extremities, pitting in nature. 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 Good femoral pulses. The patient does have some chronic venous stasis changes as well.   SKIN: Warm and dry.   PSYCH: The patient is awake, alert, and oriented x3.   LABS/STUDIES: CT of the chest with contrast shows congestive heart failure. No evidence of pulmonary embolus.   Chest x-ray is consistent with congestive heart failure.   Troponin 0.02. CBC within normal limits. Comprehensive metabolic panel within normal limits except sodium of 130, potassium 4.0, and total protein of 6.1. Magnesium 1.1. PT-INR within normal limits. B-type natriuretic peptide 3298.   EKG shows rapid atrial fibrillation.   ASSESSMENT: 81 year old Ms. Revelo with:  1. Rapid atrial fibrillation, acute on chronic. The patient currently is on a channel blocker and beta blockers. Not on anticoagulation due to GI bleed in the past and patient preference.  2. Acute congestive heart failure, suspect systolic, new onset, progressive increasing shortness of breath with leg edema, abnormal chest x-ray findings, and elevated B-type natriuretic peptide. Echo in 2011 showed hypokinetic walls with regional wall motion abnormality and ejection fraction of 45% with moderate MR and TR.  3. Hyponatremia due to congestive heart failure.  4. Chronic anemia. Had GI work-up, extensive, in 2011 leading to GI bleed secondary to presumed small intestinal bleed. 5. Left lower extremity skin cancer status post Mohs surgery recently at [facility].   PLAN:  1. Admit the patient to the Critical Care Unit.  2. FULL CODE. 3. Start the patient on Cardizem drip.  4. We will start the patient on low dose baby  aspirin. I will also start the patient on Lovenox for deep vein thrombosis prophylaxis daily. I will hold off on any anticoagulation until further discussion by cardiologist with the patient.  5. We will continue Evista.  6. Continue diuresis with Lasix 20 mg three times daily. Check ins and outs, daily weights, and replace electrolytes as needed.  7. Echo Doppler of the heart in the morning.  8. Cardiology consultation with Dr. Darrold Junker, notified. 9. Cycle cardiac enzymes x3.   Further work-up according to the patient's clinical course. The hospital admission plan was discussed with the patient and family members. The patient is agreeable to the above said plan.   CRITICAL TIME SPENT: 55 minutes. ____________________________ Wylie Hail Allena Katz, MD sap:slb D: 12/14/2011 15:50:29 ET T: 12/14/2011 16:07:23 ET JOB#: [id number]  cc: Shivam Mestas A. Allena Katz, MD, <Dictator> Jillene Bucks. Arlana Pouch, MD Marcina Millard, MD Willow Ora MD ELECTRONICALLY SIGNED 12/23/2011 13:55

## 2014-12-05 NOTE — Discharge Summary (Signed)
PATIENT NAME:  Kristina Navarro, Kristina Navarro MR#:  [id number] DATE OF BIRTH:  04-Oct-1925  DATE OF ADMISSION:  12/14/2011 DATE OF DISCHARGE:  12/18/2011  PRESENTING COMPLAINT: Shortness of breath and rapid heart rate.   DISCHARGE DIAGNOSES:  1. Rapid acute on chronic atrial fibrillation, now stable. Heart rate in 90s to 100s. Not on anticoagulation due to history of gastrointestinal bleed and patient preference.  2. Acute congestive heart failure, new onset, systolic. Ejection fraction of 45%.  3. Hyponatremia due to congestive heart failure.  4. Chronic anemia. Patient had gastrointestinal work-up with esophagogastroduodenoscopy showing hiatal hernia and colonoscopy showing internal hemorrhoids in 2011.  5. Left lower extremity skin cancer status post Mohs surgery recently at [facility].  6. Chronic tobacco abuse with chronic obstructive pulmonary disease flare, new diagnosis.   CONDITION ON DISCHARGE: Fair.   MEDICATIONS:  1. Tylenol 650 p.o. q.4 p.r.n.  2. Metoprolol 50 b.i.d.  3. Evista 60 mg daily.  4. Vitamin D3 400 mg p.o. b.i.d.  5. Combivent inhaler 2 puffs p.r.n. q.4 hours. 6. Diltiazem CD 240 p.o. daily.  7. Lisinopril 5 grams daily.  8. Multivitamin p.o. daily.  9. Aspirin 81 mg daily.  10. Nicotine patch 21 mg transdermal daily.  11. Lasix 20 mg b.i.d.  12. Mag-Ox 400 b.i.d.  13. Azithromycin 250 p.o. daily, last dose 05/10.  14. Prednisone taper.   CONSULTATION: Cardiology consultation with Dr. Darrold Junker.  DIET: 2 grams sodium diet.   ACTIVITY: Physical therapy.   FOLLOW UP: Follow up with Dr. Darrold Junker in two weeks.   LABORATORY, DIAGNOSTIC AND RADIOLOGICAL DATA: Labs at discharge: Platelet count 214, glucose 125, BUN 13, creatinine 0.53, sodium 127, potassium 3.7. Bicarbonate 29.   Magnesium 1.5, repleted.   Echo Doppler showed mildly reduced LVF, ejection fraction of 40% to 45%, left ventricular hypertrophy, moderate to severe TR, moderate MR. Cardiac enzymes x3 negative  including troponin.   CT of the chest with contrast shows congestive heart failure, mild chronic obstructive pulmonary disease, no PE. Blood cultures negative in 48 hours.   B-type natriuretic peptide 3298. PT-INR 13.4 and 1.0.   HOSPITAL COURSE: Kristina Navarro is a pleasant 81 year old Caucasian female with past medical history of atrial fibrillation and hypertension comes in with:  1. Rapid atrial fibrillation, acute on chronic. She was started on IV Cardizem drip, placed in the Intensive Care Unit.     Cardizem CD 240 mg and beta blockers was then started and patient was weaned off the Cardizem drip. She was seen by Dr. Darrold Junker. Echo noted ejection fraction of 45%. Patient does not want to be an anticoagulation given her history of GI bleed and her preference given side effects.  2. Acute congestive heart failure, new onset systolic. Patient presented with progressive increasing shortness of breath and leg edema. She diuresed about 5700 mL since admission. Creatinine was stable. Patient will be changed to p.o. Lasix twice a day for now which will defer to primary care or cardiologist to either continue or reduce the dose. Echo showed ejection fraction 40% to 45% and moderate to severe MR and TR.  3. Hyponatremia due to congestive heart failure.  4. Chronic anemia. Patient has had GI work-up in the past with EGD showing hiatal hernia and colonoscopy showing internal hemorrhoids in 2011. Her hemoglobin and hematocrit remained stable.  5. Chronic tobacco abuse. Patient advised smoking cessation. She is agreeable to it.  6. Chronic obstructive pulmonary disease flare, new diagnosis with some mild cough. Patient was started on some  oral inhalers, nebulizers were given and prednisone taper has been given. She is feeling better. She will also finish a course of Zithromax.  7. Hospital stay otherwise remained stable. CODE STATUS: Patient remained a FULL CODE.   TIME SPENT: 40  minutes.  ____________________________ Wylie HailSona A. Allena KatzPatel, MD sap:cms D: 12/18/2011 13:04:04 ET T: 12/18/2011 13:23:35 ET JOB#: [id number]  cc: Susanne Baumgarner A. Allena KatzPatel, MD, <Dictator> Marcina MillardAlexander Paraschos, MD Jillene Bucksenny C. Arlana Pouchate, MD Willow OraSONA A Meliya Mcconahy MD ELECTRONICALLY SIGNED 12/23/2011 13:56

## 2014-12-05 NOTE — Discharge Summary (Signed)
PATIENT NAME:  Kristina Navarro, Kristina Navarro DATE OF BIRTH:  1926/01/26  DATE OF ADMISSION:  12/14/2011 DATE OF DISCHARGE:  12/18/2011   ADDENDUM:  Please make a correction in her medication list:  #7 is lisinopril 5 mg daily.   ____________________________ Wylie HailSona A. Allena KatzPatel, MD sap:bjt D: 12/18/2011 14:19:56 ET T: 12/18/2011 15:10:49 ET JOB#: [id number]  cc: Deneane Stifter A. Allena KatzPatel, MD, <Dictator> Willow OraSONA A Lekeisha Arenas MD ELECTRONICALLY SIGNED 12/23/2011 13:56
